# Patient Record
Sex: Female | Born: 1980 | Race: Black or African American | Hispanic: No | Marital: Single | Smoking: Current every day smoker
Health system: Southern US, Community
[De-identification: ages and names within clinical notes are randomized; demographics above are authoritative.]

## PROBLEM LIST (undated history)

## (undated) DIAGNOSIS — J45909 Unspecified asthma, uncomplicated: Secondary | ICD-10-CM

## (undated) DIAGNOSIS — O09299 Supervision of pregnancy with other poor reproductive or obstetric history, unspecified trimester: Secondary | ICD-10-CM

## (undated) DIAGNOSIS — R519 Headache, unspecified: Secondary | ICD-10-CM

## (undated) DIAGNOSIS — O2300 Infections of kidney in pregnancy, unspecified trimester: Secondary | ICD-10-CM

## (undated) DIAGNOSIS — A599 Trichomoniasis, unspecified: Secondary | ICD-10-CM

## (undated) DIAGNOSIS — E559 Vitamin D deficiency, unspecified: Secondary | ICD-10-CM

## (undated) HISTORY — DX: Vitamin D deficiency, unspecified: E55.9

## (undated) HISTORY — PX: NO PAST SURGERIES: SHX2092

## (undated) HISTORY — DX: Trichomoniasis, unspecified: A59.9

## (undated) HISTORY — DX: Infections of kidney in pregnancy, unspecified trimester: O23.00

## (undated) HISTORY — DX: Headache, unspecified: R51.9

## (undated) HISTORY — DX: Supervision of pregnancy with other poor reproductive or obstetric history, unspecified trimester: O09.299

---
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
## 2003-09-04 ENCOUNTER — Ambulatory Visit (HOSPITAL_COMMUNITY): Admission: RE | Admit: 2003-09-04 | Discharge: 2003-09-04 | Payer: Self-pay | Admitting: Obstetrics & Gynecology

## 2003-09-26 ENCOUNTER — Inpatient Hospital Stay (HOSPITAL_COMMUNITY): Admission: AD | Admit: 2003-09-26 | Discharge: 2003-09-29 | Payer: Self-pay | Admitting: Obstetrics

## 2003-10-02 ENCOUNTER — Inpatient Hospital Stay (HOSPITAL_COMMUNITY): Admission: AD | Admit: 2003-10-02 | Discharge: 2003-10-02 | Payer: Self-pay | Admitting: Obstetrics

## 2003-11-22 ENCOUNTER — Ambulatory Visit (HOSPITAL_COMMUNITY): Admission: RE | Admit: 2003-11-22 | Discharge: 2003-11-22 | Payer: Self-pay | Admitting: Obstetrics & Gynecology

## 2004-01-06 ENCOUNTER — Inpatient Hospital Stay (HOSPITAL_COMMUNITY): Admission: AD | Admit: 2004-01-06 | Discharge: 2004-01-07 | Payer: Self-pay | Admitting: Obstetrics

## 2004-01-25 ENCOUNTER — Inpatient Hospital Stay (HOSPITAL_COMMUNITY): Admission: RE | Admit: 2004-01-25 | Discharge: 2004-01-25 | Payer: Self-pay | Admitting: Obstetrics & Gynecology

## 2004-01-28 ENCOUNTER — Inpatient Hospital Stay (HOSPITAL_COMMUNITY): Admission: AD | Admit: 2004-01-28 | Discharge: 2004-02-01 | Payer: Self-pay | Admitting: Obstetrics & Gynecology

## 2005-05-11 ENCOUNTER — Emergency Department (HOSPITAL_COMMUNITY): Admission: EM | Admit: 2005-05-11 | Discharge: 2005-05-11 | Payer: Self-pay | Admitting: Emergency Medicine

## 2005-06-08 ENCOUNTER — Emergency Department (HOSPITAL_COMMUNITY): Admission: EM | Admit: 2005-06-08 | Discharge: 2005-06-08 | Payer: Self-pay | Admitting: Emergency Medicine

## 2005-12-23 ENCOUNTER — Inpatient Hospital Stay (HOSPITAL_COMMUNITY): Admission: AD | Admit: 2005-12-23 | Discharge: 2005-12-26 | Payer: Self-pay | Admitting: Obstetrics and Gynecology

## 2006-05-24 ENCOUNTER — Emergency Department (HOSPITAL_COMMUNITY): Admission: EM | Admit: 2006-05-24 | Discharge: 2006-05-24 | Payer: Self-pay | Admitting: Emergency Medicine

## 2006-06-03 ENCOUNTER — Inpatient Hospital Stay (HOSPITAL_COMMUNITY): Admission: AD | Admit: 2006-06-03 | Discharge: 2006-06-03 | Payer: Self-pay | Admitting: Family Medicine

## 2006-06-17 ENCOUNTER — Ambulatory Visit: Payer: Self-pay | Admitting: *Deleted

## 2007-08-08 ENCOUNTER — Emergency Department (HOSPITAL_COMMUNITY): Admission: EM | Admit: 2007-08-08 | Discharge: 2007-08-08 | Payer: Self-pay | Admitting: Emergency Medicine

## 2007-09-19 ENCOUNTER — Emergency Department (HOSPITAL_COMMUNITY): Admission: EM | Admit: 2007-09-19 | Discharge: 2007-09-19 | Payer: Self-pay | Admitting: Emergency Medicine

## 2007-12-28 ENCOUNTER — Inpatient Hospital Stay (HOSPITAL_COMMUNITY): Admission: AD | Admit: 2007-12-28 | Discharge: 2007-12-28 | Payer: Self-pay | Admitting: Obstetrics and Gynecology

## 2008-05-01 ENCOUNTER — Inpatient Hospital Stay (HOSPITAL_COMMUNITY): Admission: AD | Admit: 2008-05-01 | Discharge: 2008-05-02 | Payer: Self-pay | Admitting: Obstetrics & Gynecology

## 2008-06-30 ENCOUNTER — Inpatient Hospital Stay (HOSPITAL_COMMUNITY): Admission: AD | Admit: 2008-06-30 | Discharge: 2008-06-30 | Payer: Self-pay | Admitting: Obstetrics & Gynecology

## 2008-07-21 ENCOUNTER — Inpatient Hospital Stay (HOSPITAL_COMMUNITY): Admission: AD | Admit: 2008-07-21 | Discharge: 2008-07-21 | Payer: Self-pay | Admitting: Obstetrics and Gynecology

## 2008-07-22 ENCOUNTER — Inpatient Hospital Stay (HOSPITAL_COMMUNITY): Admission: AD | Admit: 2008-07-22 | Discharge: 2008-07-23 | Payer: Self-pay | Admitting: Obstetrics and Gynecology

## 2009-05-13 ENCOUNTER — Emergency Department (HOSPITAL_COMMUNITY): Admission: EM | Admit: 2009-05-13 | Discharge: 2009-05-13 | Payer: Self-pay | Admitting: Emergency Medicine

## 2009-10-04 ENCOUNTER — Inpatient Hospital Stay (HOSPITAL_COMMUNITY): Admission: AD | Admit: 2009-10-04 | Discharge: 2009-10-06 | Payer: Self-pay | Admitting: Obstetrics and Gynecology

## 2010-04-12 ENCOUNTER — Emergency Department (HOSPITAL_COMMUNITY): Admission: EM | Admit: 2010-04-12 | Discharge: 2010-04-12 | Payer: Self-pay | Admitting: Emergency Medicine

## 2010-11-30 ENCOUNTER — Emergency Department (HOSPITAL_COMMUNITY)
Admission: EM | Admit: 2010-11-30 | Discharge: 2010-11-30 | Disposition: A | Discharge status: Home / Self Care | Payer: Medicaid Other | Attending: Emergency Medicine | Admitting: Emergency Medicine

## 2010-11-30 DIAGNOSIS — H109 Unspecified conjunctivitis: Secondary | ICD-10-CM | POA: Insufficient documentation

## 2010-12-02 ENCOUNTER — Emergency Department (HOSPITAL_COMMUNITY)
Admission: EM | Admit: 2010-12-02 | Discharge: 2010-12-02 | Disposition: A | Discharge status: Home / Self Care | Payer: Medicaid Other | Attending: Emergency Medicine | Admitting: Emergency Medicine

## 2010-12-02 DIAGNOSIS — H109 Unspecified conjunctivitis: Secondary | ICD-10-CM | POA: Insufficient documentation

## 2010-12-02 DIAGNOSIS — H5789 Other specified disorders of eye and adnexa: Secondary | ICD-10-CM | POA: Insufficient documentation

## 2010-12-21 LAB — URINALYSIS, ROUTINE W REFLEX MICROSCOPIC
Bilirubin Urine: NEGATIVE
Glucose, UA: NEGATIVE mg/dL
Hgb urine dipstick: NEGATIVE
Ketones, ur: NEGATIVE mg/dL
Nitrite: NEGATIVE
Protein, ur: NEGATIVE mg/dL
Specific Gravity, Urine: 1.008 (ref 1.005–1.030)
Urobilinogen, UA: 4 mg/dL — ABNORMAL HIGH (ref 0.0–1.0)
pH: 7 (ref 5.0–8.0)

## 2010-12-21 LAB — DIFFERENTIAL
Basophils Absolute: 0 10*3/uL (ref 0.0–0.1)
Basophils Relative: 0 % (ref 0–1)
Eosinophils Absolute: 0 10*3/uL (ref 0.0–0.7)
Eosinophils Relative: 1 % (ref 0–5)
Lymphocytes Relative: 19 % (ref 12–46)
Lymphs Abs: 1.3 10*3/uL (ref 0.7–4.0)
Monocytes Absolute: 0.4 10*3/uL (ref 0.1–1.0)
Monocytes Relative: 6 % (ref 3–12)
Neutro Abs: 5.2 10*3/uL (ref 1.7–7.7)
Neutrophils Relative %: 75 % (ref 43–77)

## 2010-12-21 LAB — CBC
HCT: 37.8 % (ref 36.0–46.0)
HCT: 32.6 % — ABNORMAL LOW (ref 36.0–46.0)
Hemoglobin: 13 g/dL (ref 12.0–15.0)
Hemoglobin: 10.6 g/dL — ABNORMAL LOW (ref 12.0–15.0)
MCH: 28.3 pg (ref 26.0–34.0)
MCHC: 34.3 g/dL (ref 30.0–36.0)
MCHC: 32.7 g/dL (ref 30.0–36.0)
MCV: 82.3 fL (ref 78.0–100.0)
MCV: 80.9 fL (ref 78.0–100.0)
Platelets: 194 10*3/uL (ref 150–400)
Platelets: 170 10*3/uL (ref 150–400)
RBC: 4.59 MIL/uL (ref 3.87–5.11)
RBC: 4.03 MIL/uL (ref 3.87–5.11)
RDW: 14.4 % (ref 11.5–15.5)
RDW: 15.5 % (ref 11.5–15.5)
WBC: 6.9 10*3/uL (ref 4.0–10.5)
WBC: 11.7 10*3/uL — ABNORMAL HIGH (ref 4.0–10.5)

## 2010-12-21 LAB — COMPREHENSIVE METABOLIC PANEL
ALT: 13 U/L (ref 0–35)
AST: 15 U/L (ref 0–37)
Albumin: 3.6 g/dL (ref 3.5–5.2)
Alkaline Phosphatase: 62 U/L (ref 39–117)
BUN: 3 mg/dL — ABNORMAL LOW (ref 6–23)
CO2: 28 mEq/L (ref 19–32)
Calcium: 8.8 mg/dL (ref 8.4–10.5)
Chloride: 102 mEq/L (ref 96–112)
Creatinine, Ser: 0.63 mg/dL (ref 0.4–1.2)
GFR calc Af Amer: >60 mL/min (ref >60)
GFR calc non Af Amer: >60 mL/min (ref >60)
Glucose, Bld: 75 mg/dL (ref 70–99)
Potassium: 3.1 mEq/L — ABNORMAL LOW (ref 3.5–5.1)
Sodium: 135 mEq/L (ref 135–145)
Total Bilirubin: 0.6 mg/dL (ref 0.3–1.2)
Total Protein: 6.9 g/dL (ref 6.0–8.3)

## 2010-12-21 LAB — GC/CHLAMYDIA PROBE AMP, GENITAL
Chlamydia, DNA Probe: POSITIVE — AB
GC Probe Amp, Genital: POSITIVE — AB

## 2010-12-21 LAB — URINE MICROSCOPIC-ADD ON

## 2010-12-21 LAB — URINE CULTURE
Colony Count: NO GROWTH
Culture: NO GROWTH

## 2010-12-21 LAB — WET PREP, GENITAL: Yeast Wet Prep HPF POC: NONE SEEN

## 2011-01-05 LAB — CBC
HCT: 34.5 % — ABNORMAL LOW (ref 36.0–46.0)
Hemoglobin: 11.2 g/dL — ABNORMAL LOW (ref 12.0–15.0)
MCHC: 32.5 g/dL (ref 30.0–36.0)
MCV: 81.8 fL (ref 78.0–100.0)
Platelets: 174 10*3/uL (ref 150–400)
RBC: 4.21 MIL/uL (ref 3.87–5.11)
RDW: 15.5 % (ref 11.5–15.5)
WBC: 9.5 10*3/uL (ref 4.0–10.5)

## 2011-01-05 LAB — RPR: RPR Ser Ql: NONREACTIVE

## 2011-01-10 LAB — URINALYSIS, ROUTINE W REFLEX MICROSCOPIC
Bilirubin Urine: NEGATIVE
Glucose, UA: NEGATIVE mg/dL
Hgb urine dipstick: NEGATIVE
Ketones, ur: NEGATIVE mg/dL
Nitrite: NEGATIVE
Protein, ur: NEGATIVE mg/dL
Specific Gravity, Urine: 1.024 (ref 1.005–1.030)
Urobilinogen, UA: 1 mg/dL (ref 0.0–1.0)
pH: 7 (ref 5.0–8.0)

## 2011-01-10 LAB — POCT PREGNANCY, URINE: Preg Test, Ur: POSITIVE

## 2011-02-20 NOTE — Discharge Summary (Signed)
NAME:  Poplaski, Kani L                          ACCOUNT NO.:  [id number]   MEDICAL RECORD NO.:  [id number]                   PATIENT TYPE:  INP   LOCATION:  9153                                 [facility]ACILITY:  [facility]   PHYSICIAN:  Charles A. Clearance Coots, M.D.             DATE OF BIRTH:  26-Jan-1981   DATE OF ADMISSION:  09/26/2003  DATE OF DISCHARGE:  09/28/2003                                 DISCHARGE SUMMARY   ADMITTING DIAGNOSIS:  Twenty-three weeks gestation.  Episode of fetal  bradycardia in the office.   DISCHARGE DIAGNOSIS:  Twenty-three weeks gestation.  Episode of fetal  bradycardia in the office, status post observation and external fetal  monitoring at [facility] [place] with reassuring findings.  Ultrasound within normal limits.   The patient was discharged home, undelivered, at 41 and 5/7ths weeks  gestation in good condition.   FOLLOW UP:  In the office in two weeks.   REASON FOR ADMISSION:  A 30 year old black female, G1, with estimated date  of confinement of January 11, 2004 by last menstrual period, and January 24, 2004  by first ultrasound, which is the best estimated date of confinement.  Presented to the office with a complaint of constant abdominal pain and  decreased fetal movement.  Fetal heart rate with the Doppler was 50-60 beats  per minute initially, and portable ultrasound was done which confirmed that  fetal heart rate.  The patient was placed on her side, and IV was quickly  started, and the fetal heart rate increased to 140-150 beats per minute  after 4-5 minutes.  The patient received an IV fluid bolus in the office,  and Emergency Medical Services were called to transfer the patient to  [facility].   PAST MEDICAL HISTORY:  1. Surgery - none.  2. Illnesses - none.   MEDICATIONS:  1. Prenatal vitamins.  2. Antibiotic for a urinary tract infection.   ALLERGIES:  No known drug allergies.   SOCIAL HISTORY:  Single.  Negative tobacco, alcohol,  or recreational drug  use.   PHYSICAL EXAMINATION:  GENERAL:  Slim black female in no acute distress.  LUNGS:  Clear to auscultation bilaterally.  HEART:  Regular rate and rhythm.  ABDOMEN:  Gravid, soft, nontender.   LABORATORY DATA:  Admitting laboratory values revealed hemoglobin of 10.6,  hematocrit 30.6, white blood cell count 7000, platelets 246,000.   HOSPITAL COURSE:  The patient was admitted and initially placed on  continuous external fetal monitoring.  There were no further fetal heart  rate decelerations noted.  She was then observed for another 24 hours,  monitoring external fetal heart rate every shift, and the monitoring  continued to be reassuring.  The patient was therefore discharged home on  hospital day #2, undelivered at 40 and 5/7ths weeks gestation.  Ultrasound  was done on hospital day #1, which revealed anterior grade 1 placenta,  normal amniotic fluid,  normal cervical length of 3.4 cm, and appropriate  interval fetal growth, based on the first ultrasound which was done at [redacted]  weeks gestation.   DISCHARGE MEDICATIONS:  Continue prenatal vitamins.   DISCHARGE INSTRUCTIONS:  Routine written obstetrical discharge instructions  for undelivered patients were given.   ACTIVITY:  The patient is to be restricted to modified bed rest at home.   FOLLOW UP:  She is to follow up in the office for a prenatal visit in two  weeks.                                               Charles A. Clearance Coots, M.D.    CAH/MEDQ  D:  09/28/2003  T:  09/28/2003  Job:  [id number]

## 2011-02-20 NOTE — Group Therapy Note (Signed)
[name], Horn                ACCOUNT NO.:  [id number]   MEDICAL RECORD NO.:  [id number]          PATIENT TYPE:  WOC   LOCATION:  [facility]                   FACILITY:  [facility]   PHYSICIAN:  Carolanne Grumbling, M.D.   DATE OF BIRTH:  1981-03-21   DATE OF SERVICE:                                    CLINIC NOTE   CHIEF COMPLAINT:  Followup on miscarriage.   HISTORY OF PRESENT ILLNESS:  A 30 year old G3, P2-0-1-2, here for MAU  followup on a spontaneous abortion. The patient presented on June 03, 2006  to the MAU and was found to have no fetal heart tones and opted for expected  management. The patient reports that she passed particles of conception on  June 05, 2006. Lochia was heavy but has since stopped. The patient was  also diagnosed with Chlamydia on my MAU visit but has not had treatment yet.  She does not desire pregnancy at this point and would like to be on birth  control.   PAST MEDICAL HISTORY:  Negative.   PAST SURGICAL HISTORY:  Negative.   GYNECOLOGIC HISTORY:  Recently had a pap smear in March for her postpartum  examination. Denies any abnormalities.   SOCIAL HISTORY:  She is single. Lives with her children. No cigarettes.  Denies alcohol or drug use.   FAMILY HISTORY:  Rachel Horn has diabetes. History of heart attack and  hypertension. Grandmother also has Hypertension.   MEDICATIONS:  None.   ALLERGIES:  PENICILLIN.   PHYSICAL EXAMINATION:  VITAL SIGNS:  Temperature 97. Blood pressure 131/86,  pulse 76, weight 118.5 pounds. Height 5 foot 1 and 1/2 inches.  GENERAL:  Well developed, well nourished female in no acute distress.  HEENT:  Normocephalic and atraumatic.  CARDIOVASCULAR:  Regular rate and rhythm.  LUNGS:  Clear to auscultation.  ABDOMEN:  Soft, nontender, and nondistended.  GENITOURINARY:  Vagina pink and rugae. Cervix, multiparus. Uterus within  normal limits. Adnexa free of masses.  EXTREMITIES:  No clubbing, cyanosis, or edema.   ASSESSMENT:  1. Status post spontaneous abortion.  2. Chlamydia.  3. Elevated blood pressure.   PLAN:  The case was discussed with Dr. Perlie Gold. Will check a beta HCG  quantitative value. If that is within normal limits, no further workup is  needed.  1. Azithromycin 1,000 mg p.o. x1, given today. The patient counseled that      other partners needed to be treated.  2. Depo-Provera, given today. The patient to get that every 3 months.  3. Encouraged her to check her blood pressure and to followup. She may      need to be on hydrochlorothiazide.           ______________________________  Carolanne Grumbling, M.D.     TW/MEDQ  D:  06/21/2006  T:  06/21/2006  Job:  [id number]

## 2011-05-19 ENCOUNTER — Emergency Department (HOSPITAL_COMMUNITY)
Admission: EM | Admit: 2011-05-19 | Discharge: 2011-05-19 | Disposition: A | Discharge status: Home / Self Care | Payer: Medicaid Other | Attending: Emergency Medicine | Admitting: Emergency Medicine

## 2011-05-19 DIAGNOSIS — J3489 Other specified disorders of nose and nasal sinuses: Secondary | ICD-10-CM | POA: Insufficient documentation

## 2011-05-19 DIAGNOSIS — K089 Disorder of teeth and supporting structures, unspecified: Secondary | ICD-10-CM | POA: Insufficient documentation

## 2011-05-19 DIAGNOSIS — K029 Dental caries, unspecified: Secondary | ICD-10-CM | POA: Insufficient documentation

## 2011-05-19 DIAGNOSIS — R220 Localized swelling, mass and lump, head: Secondary | ICD-10-CM | POA: Insufficient documentation

## 2011-06-29 LAB — CBC
HCT: 35.3 — ABNORMAL LOW
Hemoglobin: 11.7 — ABNORMAL LOW
MCHC: 33.3
Platelets: 260
RDW: 13.6

## 2011-06-29 LAB — URINALYSIS, ROUTINE W REFLEX MICROSCOPIC
Bilirubin Urine: NEGATIVE
Nitrite: NEGATIVE
Specific Gravity, Urine: 1.02
Urobilinogen, UA: 1
pH: 8.5 — ABNORMAL HIGH

## 2011-06-29 LAB — WET PREP, GENITAL: Clue Cells Wet Prep HPF POC: NONE SEEN

## 2011-06-29 LAB — GC/CHLAMYDIA PROBE AMP, GENITAL: Chlamydia, DNA Probe: POSITIVE — AB

## 2011-07-06 LAB — CBC
Hemoglobin: 9.7 — ABNORMAL LOW
RBC: 3.64 — ABNORMAL LOW
WBC: 9.6

## 2011-07-06 LAB — RPR: RPR Ser Ql: NONREACTIVE

## 2011-07-13 ENCOUNTER — Inpatient Hospital Stay (INDEPENDENT_AMBULATORY_CARE_PROVIDER_SITE_OTHER)
Admission: RE | Admit: 2011-07-13 | Discharge: 2011-07-13 | Disposition: A | Discharge status: Home / Self Care | Payer: Medicaid Other | Source: Ambulatory Visit | Attending: Family Medicine | Admitting: Family Medicine

## 2011-07-13 ENCOUNTER — Emergency Department (HOSPITAL_COMMUNITY): Payer: Medicaid Other

## 2011-07-13 ENCOUNTER — Emergency Department (HOSPITAL_COMMUNITY)
Admission: EM | Admit: 2011-07-13 | Discharge: 2011-07-13 | Disposition: A | Discharge status: Home / Self Care | Payer: Medicaid Other | Attending: Emergency Medicine | Admitting: Emergency Medicine

## 2011-07-13 DIAGNOSIS — J36 Peritonsillar abscess: Secondary | ICD-10-CM | POA: Insufficient documentation

## 2011-07-13 DIAGNOSIS — J029 Acute pharyngitis, unspecified: Secondary | ICD-10-CM | POA: Insufficient documentation

## 2011-07-13 LAB — CBC
MCH: 28.7 pg (ref 26.0–34.0)
MCHC: 34.3 g/dL (ref 30.0–36.0)
Platelets: 217 10*3/uL (ref 150–400)
RBC: 4.78 MIL/uL (ref 3.87–5.11)

## 2011-07-13 LAB — POCT I-STAT, CHEM 8
BUN: 6 mg/dL (ref 6–23)
Calcium, Ion: 1.14 mmol/L (ref 1.12–1.32)
Hemoglobin: 14.6 g/dL (ref 12.0–15.0)
Sodium: 137 mEq/L (ref 135–145)
TCO2: 23 mmol/L (ref 0–100)

## 2011-07-13 LAB — DIFFERENTIAL
Basophils Absolute: 0 10*3/uL (ref 0.0–0.1)
Basophils Relative: 0 % (ref 0–1)
Eosinophils Absolute: 0 10*3/uL (ref 0.0–0.7)
Monocytes Relative: 8 % (ref 3–12)
Neutro Abs: 15.2 10*3/uL — ABNORMAL HIGH (ref 1.7–7.7)
Neutrophils Relative %: 85 % — ABNORMAL HIGH (ref 43–77)

## 2011-07-13 LAB — WET PREP, GENITAL
Trich, Wet Prep: NONE SEEN
Yeast Wet Prep HPF POC: NONE SEEN

## 2011-07-13 MED ORDER — IOHEXOL 300 MG/ML  SOLN
80.0000 mL | Freq: Once | INTRAMUSCULAR | Status: AC | PRN
  Administered 2011-07-13: 80 mL via INTRAVENOUS

## 2011-07-14 LAB — GC/CHLAMYDIA PROBE AMP, GENITAL
Chlamydia, DNA Probe: NEGATIVE
Chlamydia, DNA Probe: POSITIVE — AB
GC Probe Amp, Genital: NEGATIVE
GC Probe Amp, Genital: NEGATIVE

## 2011-07-14 LAB — WET PREP, GENITAL: Yeast Wet Prep HPF POC: NONE SEEN

## 2011-07-14 LAB — POCT PREGNANCY, URINE
Operator id: 19830
Preg Test, Ur: NEGATIVE

## 2011-07-14 LAB — URINALYSIS, ROUTINE W REFLEX MICROSCOPIC
Bilirubin Urine: NEGATIVE
Hgb urine dipstick: NEGATIVE
Nitrite: NEGATIVE
Protein, ur: NEGATIVE
Urobilinogen, UA: 1

## 2011-07-15 ENCOUNTER — Emergency Department (HOSPITAL_COMMUNITY)
Admission: EM | Admit: 2011-07-15 | Discharge: 2011-07-15 | Disposition: A | Discharge status: Home / Self Care | Payer: Medicaid Other | Attending: Emergency Medicine | Admitting: Emergency Medicine

## 2011-07-15 DIAGNOSIS — F17200 Nicotine dependence, unspecified, uncomplicated: Secondary | ICD-10-CM | POA: Insufficient documentation

## 2011-07-15 DIAGNOSIS — J039 Acute tonsillitis, unspecified: Secondary | ICD-10-CM | POA: Insufficient documentation

## 2011-07-15 NOTE — Consult Note (Signed)
NAMEJAHNAVI, Rachel Horn                ACCOUNT NO.:  [id number]  MEDICAL RECORD NO.:  [id number]  LOCATION:  [facility]                         FACILITY:  [facility]  PHYSICIAN:  Melvenia Beam, MD      DATE OF BIRTH:  [birth year]  DATE OF CONSULTATION:  07/15/2011 DATE OF DISCHARGE:                                CONSULTATION   REASON FOR CONSULTATION:  Suspected right peritonsillar abscess.  HISTORY OF PRESENT ILLNESS:  This is a 30 year old African American female who has a history of a recent right peritonsillar phlegmon and chronic tonsillitis and this was drained on July 13, 2011, by the patient's report.  She reports that she has been having difficulty taking p.o. over the last 4 days and has had recurrent swelling and pain on the right side of the throat.  ENT is consulted for possible recurrent peritonsillar abscess.  PAST MEDICAL HISTORY/PAST SURGICAL HISTORY:  Significant for chronic tonsillitis.  MEDICATIONS:  She takes no medicines at home.  ALLERGIES:  PENICILLIN.  PHYSICAL EXAMINATION:  VITAL SIGNS:  She is afebrile, heart rate is 80s to 90s, respiratory rate is 12 to 16, and vital signs are otherwise stable and reviewed on the ED chart. HEENT:  Tympanic membranes were unclear bilaterally. NECK:  Supple.  No lymphadenopathy. NEUROLOGIC:  Cranial nerves II through XII were grossly intact and symmetric bilaterally.  Extraocular movements are intact.  Pupils are equal, round, and reactive to light and accommodation.  Anterior rhinoscopy demonstrates normal appearing septum and turbinates. Oral cavity exam demonstrates a right 4+ tonsils with a left 2+ tonsils. The right tonsil has an exudate over the entire medial surface.  There was evidence of a previous incision and drainage site on the palatoglossal muscle.  PROCEDURE NOTE:  42700, incision and drainage of right peritonsillar phlegmon after explaining the risks including bleeding, infection, injury to the carotid  artery and obtaining informed verbal consent, the area was anesthetized with viscous lidocaine and benzocaine spray and then injection with 1% lidocaine with epinephrine.  Once this had taken effect, the area was aspirated multiple times with an 18-gauge needle with a 5 cc syringe, only minimal phlegmon was aspirated.  No evidence of any significant peritonsillar abscess was aspirated from multiple sites medially, laterally, and superiorly.  The previous incision and drainage site was re-incised with a 15 blade and widely spread in the peritonsillar space using the Kelly clamp.  This was suctioned and again no peritonsillar pus was seen.  IMPRESSION AND PLAN:  Likely right severe tonsillar hypertrophy with acute on chronic tonsillitis without any evidence of significant peritonsillar phlegmon or abscess.  I strongly counseled the patient that she will need a tonsillectomy once the infection and inflammation are calmed down,  or this cycle of severe tonsillitis/tonsillar cellulitis will likely keep repeating itself. She will need a bilateral tonsillectomy to prevent this severe tonsillitis from happening chronically and it would be dangerous to do this in the acute setting as her risk of bleeding would be extremely high as the right tonsil was acutely inflamed.  I would like to calm her down with doxycycline and prednisone liquid as she states she is not able to take pills  and I will see her back in my office at [facility] in 1 week to set her up for bilateral tonsillectomy. She understands that she should call sooner or return to the ER if she has any intractable bleeding or poor PO intake or airway problems.          ______________________________ Melvenia Beam, MD     MG/MEDQ  D:  07/15/2011  T:  07/15/2011  Job:  [id number]  Electronically Signed by Melvenia Beam MD on 07/15/2011 12:47:42 PM

## 2011-11-02 ENCOUNTER — Encounter (HOSPITAL_COMMUNITY): Payer: Self-pay | Admitting: *Deleted

## 2011-11-02 ENCOUNTER — Emergency Department (HOSPITAL_COMMUNITY)
Admission: EM | Admit: 2011-11-02 | Discharge: 2011-11-02 | Disposition: A | Discharge status: Home / Self Care | Payer: Medicaid Other | Attending: Emergency Medicine | Admitting: Emergency Medicine

## 2011-11-02 DIAGNOSIS — K089 Disorder of teeth and supporting structures, unspecified: Secondary | ICD-10-CM | POA: Insufficient documentation

## 2011-11-02 DIAGNOSIS — K047 Periapical abscess without sinus: Secondary | ICD-10-CM | POA: Insufficient documentation

## 2011-11-02 DIAGNOSIS — K056 Periodontal disease, unspecified: Secondary | ICD-10-CM | POA: Insufficient documentation

## 2011-11-02 DIAGNOSIS — K029 Dental caries, unspecified: Secondary | ICD-10-CM | POA: Insufficient documentation

## 2011-11-02 DIAGNOSIS — R6884 Jaw pain: Secondary | ICD-10-CM | POA: Insufficient documentation

## 2011-11-02 MED ORDER — HYDROCODONE-ACETAMINOPHEN 5-325 MG PO TABS: 2.0000 | ORAL_TABLET | ORAL | Status: AC | PRN

## 2011-11-02 MED ORDER — CLINDAMYCIN HCL 150 MG PO CAPS: 150.0000 mg | ORAL_CAPSULE | Freq: Four times a day (QID) | ORAL | Status: AC

## 2011-11-02 NOTE — ED Provider Notes (Signed)
History     CSN: [id number]  Arrival date & time 11/02/11  1831   First MD Initiated Contact with Patient 11/02/11 1908      Chief Complaint  Patient presents with  . Dental Pain    (Consider location/radiation/quality/duration/timing/severity/associated sxs/prior treatment) HPI Comments: Patient here with worsening toothache for past 2 days - states had tooth with filling, which fell out months ago -now with pain and has worn down to the root.  Patient is a 31 y.o. female presenting with tooth pain. The history is provided by the patient. No language interpreter was used.  Dental PainPrimary symptoms do not include mouth pain, dental injury, oral bleeding, oral lesions, headaches, fever, shortness of breath, sore throat, angioedema or cough. The symptoms began 2 days ago. The symptoms are worsening. The symptoms are chronic. The symptoms occur constantly.  Additional symptoms include: dental sensitivity to temperature, gum tenderness and jaw pain. Additional symptoms do not include: purulent gums, trismus, facial swelling, pain with swallowing, dry mouth, smell disturbance, drooling, ear pain, hearing loss, swollen glands and fatigue. Medical issues include: periodontal disease.    History reviewed. No pertinent past medical history.  History reviewed. No pertinent past surgical history.  History reviewed. No pertinent family history.  History  Substance Use Topics  . Smoking status: Current Everyday Smoker  . Smokeless tobacco: Not on file  . Alcohol Use: No    OB History    Grav Para Term Preterm Abortions TAB SAB Ect Mult Living                  Review of Systems  Constitutional: Negative for fever and fatigue.  HENT: Negative for hearing loss, ear pain, sore throat, facial swelling and drooling.   Respiratory: Negative for cough and shortness of breath.   Neurological: Negative for headaches.  All other systems reviewed and are negative.    Allergies  Latex and  Penicillins  Home Medications   Current Outpatient Rx  Name Route Sig Dispense Refill  . NAPROXEN SODIUM 220 MG PO TABS Oral Take 220 mg by mouth 2 (two) times daily as needed. For pain.    Marland Kitchen CLINDAMYCIN HCL 150 MG PO CAPS Oral Take 1 capsule (150 mg total) by mouth every 6 (six) hours. 28 capsule 0  . HYDROCODONE-ACETAMINOPHEN 5-325 MG PO TABS Oral Take 2 tablets by mouth every 4 (four) hours as needed for pain. 20 tablet 0    BP 115/71  Pulse 64  Temp(Src) 98.8 F (37.1 C) (Oral)  Resp 18  SpO2 98%  Physical Exam  Nursing note and vitals reviewed. Constitutional: She is oriented to person, place, and time. She appears well-developed and well-nourished. No distress.  HENT:  Head: Normocephalic and atraumatic.  Right Ear: External ear normal.  Left Ear: External ear normal.  Nose: Nose normal.  Mouth/Throat: Oropharynx is clear and moist. No oropharyngeal exudate.    Eyes: Conjunctivae are normal. Pupils are equal, round, and reactive to light. No scleral icterus.  Neck: Normal range of motion. Neck supple.  Cardiovascular: Normal rate, regular rhythm and normal heart sounds.  Exam reveals no gallop and no friction rub.   No murmur heard. Pulmonary/Chest: Effort normal and breath sounds normal. No respiratory distress.  Abdominal: Soft. Bowel sounds are normal. There is no tenderness.  Musculoskeletal: Normal range of motion.  Lymphadenopathy:    She has no cervical adenopathy.  Neurological: She is alert and oriented to person, place, and time. No cranial nerve deficit.  Skin: Skin is warm and dry.  Psychiatric: She has a normal mood and affect. Her behavior is normal. Judgment and thought content normal.    ED Course  Procedures (including critical care time)  Labs Reviewed - No data to display No results found.   1. Dental abscess       MDM  Marked decay and early abscess formation - is to follow up with her dentist this coming week.        Izola Price [place], [place] 11/02/11 2050

## 2011-11-02 NOTE — ED Notes (Signed)
She has had a toothache for 2 days

## 2011-11-02 NOTE — ED Provider Notes (Signed)
Medical screening examination/treatment/procedure(s) were performed by non-physician practitioner and as supervising physician I was immediately available for consultation/collaboration.   Benny Lennert, MD 11/02/11 2116

## 2012-01-07 ENCOUNTER — Encounter (HOSPITAL_COMMUNITY): Payer: Self-pay | Admitting: Emergency Medicine

## 2012-01-07 ENCOUNTER — Emergency Department (HOSPITAL_COMMUNITY)
Admission: EM | Admit: 2012-01-07 | Discharge: 2012-01-08 | Disposition: A | Discharge status: Home / Self Care | Payer: Medicaid Other | Attending: Emergency Medicine | Admitting: Emergency Medicine

## 2012-01-07 DIAGNOSIS — F17200 Nicotine dependence, unspecified, uncomplicated: Secondary | ICD-10-CM | POA: Insufficient documentation

## 2012-01-07 DIAGNOSIS — A5901 Trichomonal vulvovaginitis: Secondary | ICD-10-CM | POA: Insufficient documentation

## 2012-01-07 DIAGNOSIS — R51 Headache: Secondary | ICD-10-CM | POA: Insufficient documentation

## 2012-01-07 MED ORDER — SODIUM CHLORIDE 0.9 % IV BOLUS (SEPSIS)
1000.0000 mL | Freq: Once | INTRAVENOUS | Status: AC
  Administered 2012-01-08: 1000 mL via INTRAVENOUS

## 2012-01-07 MED ORDER — ONDANSETRON HCL 4 MG/2ML IJ SOLN
4.0000 mg | Freq: Once | INTRAMUSCULAR | Status: AC
  Administered 2012-01-08: 4 mg via INTRAVENOUS
  Filled 2012-01-07: qty 2

## 2012-01-07 NOTE — ED Notes (Signed)
Headache for several days.  She vomited earllier

## 2012-01-07 NOTE — ED Notes (Signed)
PT. REPORTS HEADACHE TODAY WITH VOMITTING AND DIARRHEA YESTERDAY.

## 2012-01-08 LAB — WET PREP, GENITAL
Clue Cells Wet Prep HPF POC: NONE SEEN
Yeast Wet Prep HPF POC: NONE SEEN

## 2012-01-08 MED ORDER — METRONIDAZOLE 500 MG PO TABS: 500.0000 mg | ORAL_TABLET | Freq: Once | ORAL | Status: DC

## 2012-01-08 MED ORDER — METRONIDAZOLE 500 MG PO TABS: 500.0000 mg | ORAL_TABLET | Freq: Two times a day (BID) | ORAL | Status: AC

## 2012-01-08 MED ORDER — ONDANSETRON HCL 4 MG PO TABS: 4.0000 mg | ORAL_TABLET | Freq: Four times a day (QID) | ORAL | Status: AC

## 2012-01-08 NOTE — Discharge Instructions (Signed)
Trichomoniasis  Trichomoniasis is an infection, caused by the Trichomonas organism, that affects both women and men. In women, the outer female genitalia and the vagina are affected. In men, the penis is mainly affected, but the prostate and other reproductive organs can also be involved. Trichomoniasis is a sexually transmitted disease (STD) and is most often passed to another person through sexual contact. The majority of people who get trichomoniasis do so from a sexual encounter and are also at risk for other STDs.  CAUSES    Sexual intercourse with an infected partner.   It can be present in swimming pools or hot tubs.  SYMPTOMS    Abnormal gray-green frothy vaginal discharge in women.   Vaginal itching and irritation in women.   Itching and irritation of the area outside the vagina in women.   Penile discharge with or without pain in males.   Inflammation of the urethra (urethritis), causing painful urination.   Bleeding after sexual intercourse.  RELATED COMPLICATIONS   Pelvic inflammatory disease.   Infection of the uterus (endometritis).   Infertility.   Tubal (ectopic) pregnancy.   It can be associated with other STDs, including gonorrhea and chlamydia, hepatitis B, and HIV.  COMPLICATIONS DURING PREGNANCY   Early (premature) delivery.   Premature rupture of the membranes (PROM).   Low birth weight.  DIAGNOSIS    Visualization of Trichomonas under the microscope from the vagina discharge.   Ph of the vagina greater than 4.5, tested with a test tape.   Trich Rapid Test.   Culture of the organism, but this is not usually needed.   It may be found on a Pap test.   Having a "strawberry cervix,"which means the cervix looks very red like a strawberry.  TREATMENT    You may be given medication to fight the infection. Inform your caregiver if you could be or are pregnant. Some medications used to treat the infection should not be taken during pregnancy.   Over-the-counter medications or  creams to decrease itching or irritation may be recommended.   Your sexual partner will need to be treated if infected.  HOME CARE INSTRUCTIONS    Take all medication prescribed by your caregiver.   Take over-the-counter medication for itching or irritation as directed by your caregiver.   Do not have sexual intercourse while you have the infection.   Do not douche or wear tampons.   Discuss your infection with your partner, as your partner may have acquired the infection from you. Or, your partner may have been the person who transmitted the infection to you.   Have your sex partner examined and treated if necessary.   Practice safe, informed, and protected sex.   See your caregiver for other STD testing.  SEEK MEDICAL CARE IF:    You still have symptoms after you finish the medication.   You have an oral temperature above 102 F (38.9 C).   You develop belly (abdominal) pain.   You have pain when you urinate.   You have bleeding after sexual intercourse.   You develop a rash.   The medication makes you sick or makes you throw up (vomit).  Document Released: 03/17/2001 Document Revised: 09/10/2011 Document Reviewed: 04/12/2009  ExitCare Patient Information 2012 [employer].

## 2012-01-08 NOTE — ED Provider Notes (Addendum)
History     CSN: [id number]  Arrival date & time 01/07/12  2105   First MD Initiated Contact with Patient 01/07/12 2322      Chief Complaint  Patient presents with  . Headache    (Consider location/radiation/quality/duration/timing/severity/associated sxs/prior treatment) HPI Comments: Nausea and vomiting, unrelieved by any over-the-counter medication.  Patient was taken throughout the day  The history is provided by the patient.    History reviewed. No pertinent past medical history.  History reviewed. No pertinent past surgical history.  No family history on file.  History  Substance Use Topics  . Smoking status: Current Everyday Smoker  . Smokeless tobacco: Not on file  . Alcohol Use: No    OB History    Grav Para Term Preterm Abortions TAB SAB Ect Mult Living                  Review of Systems  Gastrointestinal: Positive for nausea and vomiting.  Neurological: Positive for headaches.    Allergies  Latex and Penicillins  Home Medications   Current Outpatient Rx  Name Route Sig Dispense Refill  . METRONIDAZOLE 500 MG PO TABS Oral Take 1 tablet (500 mg total) by mouth 2 (two) times daily. 14 tablet 0  . ONDANSETRON HCL 4 MG PO TABS Oral Take 1 tablet (4 mg total) by mouth every 6 (six) hours. 12 tablet 0    BP 120/72  Pulse 70  Temp(Src) 98.4 F (36.9 C) (Oral)  Resp 16  SpO2 99%  Physical Exam  Constitutional: She appears well-developed and well-nourished.  HENT:  Head: Normocephalic.  Eyes: Pupils are equal, round, and reactive to light.  Neck: Normal range of motion.  Cardiovascular: Normal rate.   Pulmonary/Chest: Effort normal.  Abdominal: She exhibits no distension. There is no tenderness.  Genitourinary: Uterus normal. Vaginal discharge found.  Musculoskeletal: Normal range of motion.  Skin: Skin is warm.    ED Course  Procedures (including critical care time)  Labs Reviewed  WET PREP, GENITAL - Abnormal; Notable for the  following:    Trich, Wet Prep MODERATE (*)    WBC, Wet Prep HPF POC FEW (*)    All other components within normal limits  POCT PREGNANCY, URINE  GC/CHLAMYDIA PROBE AMP, GENITAL   No results found.   1. Trichomonal vaginitis    Feels better after the IV fluids   MDM  Nausea, vomiting, vaginal discharge       Arman Filter, NP 01/08/12 0150  Arman Filter, NP 01/08/12 0150  Arman Filter, NP 03/08/12 2109

## 2012-01-08 NOTE — ED Provider Notes (Signed)
Medical screening examination/treatment/procedure(s) were performed by non-physician practitioner and as supervising physician I was immediately available for consultation/collaboration.  Harith Mccadden M Edithe Dobbin, MD 01/08/12 0705 

## 2012-01-09 LAB — GC/CHLAMYDIA PROBE AMP, GENITAL: Chlamydia, DNA Probe: NEGATIVE

## 2012-03-09 NOTE — ED Provider Notes (Signed)
Medical screening examination/treatment/procedure(s) were performed by non-physician practitioner and as supervising physician I was immediately available for consultation/collaboration.  Sanaa Zilberman M Flynn Lininger, MD 03/09/12 0347 

## 2012-06-30 ENCOUNTER — Ambulatory Visit
Admission: RE | Admit: 2012-06-30 | Discharge: 2012-06-30 | Disposition: A | Discharge status: Home / Self Care | Payer: No Typology Code available for payment source | Source: Ambulatory Visit | Attending: Infectious Diseases | Admitting: Infectious Diseases

## 2012-06-30 ENCOUNTER — Other Ambulatory Visit: Payer: Self-pay | Admitting: Infectious Diseases

## 2012-06-30 DIAGNOSIS — R7611 Nonspecific reaction to tuberculin skin test without active tuberculosis: Secondary | ICD-10-CM

## 2012-08-04 ENCOUNTER — Emergency Department (HOSPITAL_COMMUNITY)
Admission: EM | Admit: 2012-08-04 | Discharge: 2012-08-04 | Disposition: A | Discharge status: Home Health | Payer: Medicaid Other | Attending: Emergency Medicine | Admitting: Emergency Medicine

## 2012-08-04 DIAGNOSIS — R51 Headache: Secondary | ICD-10-CM

## 2012-08-04 DIAGNOSIS — J029 Acute pharyngitis, unspecified: Secondary | ICD-10-CM | POA: Insufficient documentation

## 2012-08-04 DIAGNOSIS — F17200 Nicotine dependence, unspecified, uncomplicated: Secondary | ICD-10-CM | POA: Insufficient documentation

## 2012-08-04 MED ORDER — KETOROLAC TROMETHAMINE 60 MG/2ML IM SOLN
60.0000 mg | Freq: Once | INTRAMUSCULAR | Status: AC
  Administered 2012-08-04: 60 mg via INTRAMUSCULAR
  Filled 2012-08-04: qty 2

## 2012-08-04 MED ORDER — IBUPROFEN 800 MG PO TABS: 800.0000 mg | ORAL_TABLET | Freq: Three times a day (TID) | ORAL | Status: DC

## 2012-08-04 NOTE — ED Notes (Signed)
Drinking soda without problems

## 2012-08-04 NOTE — ED Provider Notes (Addendum)
History     CSN: [id number]  Arrival date & time 08/04/12  0902   First MD Initiated Contact with Patient 08/04/12 [phone]      Chief Complaint  Patient presents with  . Headache    (Consider location/radiation/quality/duration/timing/severity/associated sxs/prior treatment) HPI Comments: Patient complains of 5 days of gradual onset headache that is intermittent. It is associated with feeling of phlegm in her throat and pain with swallowing. She also endorses loose stools for the past 5 days and happen once daily. No abdominal pain, fever, vomiting, chest pain or shortness of breath. No rashes. No sick contacts. No other medical problems. She thinks her headache is due to stress. She denies thunderclap onset. She denies any weakness, numbness or tingling.  The history is provided by the patient.    No past medical history on file.  No past surgical history on file.  No family history on file.  History  Substance Use Topics  . Smoking status: Current Every Day Smoker  . Smokeless tobacco: Not on file  . Alcohol Use: No    OB History    Grav Para Term Preterm Abortions TAB SAB Ect Mult Living                  Review of Systems  Constitutional: Negative for fever, activity change and appetite change.  HENT: Positive for congestion, sore throat, rhinorrhea and trouble swallowing.   Respiratory: Negative for cough, chest tightness and shortness of breath.   Cardiovascular: Negative for chest pain.  Gastrointestinal: Negative for nausea, vomiting and abdominal pain.  Musculoskeletal: Negative for back pain.  Skin: Negative for wound.  Neurological: Positive for headaches. Negative for dizziness.  A complete 10 system review of systems was obtained and all systems are negative except as noted in the HPI and PMH.    Allergies  Latex and Penicillins  Home Medications  No current outpatient prescriptions on file.  BP 115/78  Temp 98.2 F (36.8 C) (Oral)  Resp 20  SpO2  99%  Physical Exam  Constitutional: She is oriented to person, place, and time. She appears well-developed and well-nourished. No distress.  HENT:  Head: Normocephalic and atraumatic.  Mouth/Throat: Oropharynx is clear and moist. No oropharyngeal exudate.       No exudate, no tonsillar asymmetry, no tongue elevation R upper molar TTP without abscess  Eyes: Conjunctivae normal and EOM are normal. Pupils are equal, round, and reactive to light.  Neck: Normal range of motion. Neck supple.       No meningismus  Cardiovascular: Normal rate, regular rhythm and normal heart sounds.   No murmur heard. Pulmonary/Chest: Effort normal and breath sounds normal. No respiratory distress.  Abdominal: Soft. There is no tenderness. There is no rebound and no guarding.  Musculoskeletal: Normal range of motion. She exhibits no edema and no tenderness.  Neurological: She is alert and oriented to person, place, and time. No cranial nerve deficit.       5/5 strength throughout, no ataxia on finger to nose, CN 2-12 intact  Skin: Skin is warm.    ED Course  Procedures (including critical care time)   Labs Reviewed  RAPID STREP SCREEN   No results found.   No diagnosis found.    MDM  Sore throat, and gradual onset headache for the past several days. Nontoxic appearing, no distress, vitals stable.  Nonfocal neurological exam. Rapid strep negative. Tolerating by mouth in the ED. Symptom relief with Toradol. Supportive care, followup with PCP.  Glynn Octave, MD 08/04/12 1008  Glynn Octave, MD 11/08/12 [phone]

## 2012-08-04 NOTE — ED Notes (Signed)
Patient swallows without difficulty

## 2012-08-04 NOTE — ED Notes (Signed)
Pt c/o throat soreness and headache x 2 days

## 2013-03-11 ENCOUNTER — Encounter (HOSPITAL_COMMUNITY): Payer: Self-pay | Admitting: Adult Health

## 2013-03-11 ENCOUNTER — Emergency Department (HOSPITAL_COMMUNITY)
Admission: EM | Admit: 2013-03-11 | Discharge: 2013-03-11 | Disposition: A | Discharge status: Home / Self Care | Payer: Medicaid Other | Attending: Emergency Medicine | Admitting: Emergency Medicine

## 2013-03-11 DIAGNOSIS — Z91040 Latex allergy status: Secondary | ICD-10-CM | POA: Insufficient documentation

## 2013-03-11 DIAGNOSIS — R51 Headache: Secondary | ICD-10-CM | POA: Insufficient documentation

## 2013-03-11 DIAGNOSIS — F17200 Nicotine dependence, unspecified, uncomplicated: Secondary | ICD-10-CM | POA: Insufficient documentation

## 2013-03-11 DIAGNOSIS — Z975 Presence of (intrauterine) contraceptive device: Secondary | ICD-10-CM | POA: Insufficient documentation

## 2013-03-11 DIAGNOSIS — R599 Enlarged lymph nodes, unspecified: Secondary | ICD-10-CM | POA: Insufficient documentation

## 2013-03-11 DIAGNOSIS — R05 Cough: Secondary | ICD-10-CM | POA: Insufficient documentation

## 2013-03-11 DIAGNOSIS — J029 Acute pharyngitis, unspecified: Secondary | ICD-10-CM | POA: Insufficient documentation

## 2013-03-11 DIAGNOSIS — R1110 Vomiting, unspecified: Secondary | ICD-10-CM | POA: Insufficient documentation

## 2013-03-11 DIAGNOSIS — R059 Cough, unspecified: Secondary | ICD-10-CM | POA: Insufficient documentation

## 2013-03-11 DIAGNOSIS — Z880 Allergy status to penicillin: Secondary | ICD-10-CM | POA: Insufficient documentation

## 2013-03-11 MED ORDER — ONDANSETRON 4 MG PO TBDP
8.0000 mg | ORAL_TABLET | Freq: Once | ORAL | Status: AC
  Administered 2013-03-11: 8 mg via ORAL
  Filled 2013-03-11: qty 2

## 2013-03-11 MED ORDER — OXYCODONE HCL 5 MG/5ML PO SOLN: 5.0000 mg | ORAL | Status: DC | PRN

## 2013-03-11 MED ORDER — AZITHROMYCIN 250 MG PO TABS: 250.0000 mg | ORAL_TABLET | Freq: Every day | ORAL | Status: DC

## 2013-03-11 MED ORDER — DEXAMETHASONE 10 MG/ML FOR PEDIATRIC ORAL USE
10.0000 mg | Freq: Once | INTRAMUSCULAR | Status: AC
  Administered 2013-03-11: 10 mg via ORAL
  Filled 2013-03-11 (×2): qty 1

## 2013-03-11 MED ORDER — OXYCODONE HCL 5 MG/5ML PO SOLN
5.0000 mg | Freq: Once | ORAL | Status: AC
  Administered 2013-03-11: 5 mg via ORAL
  Filled 2013-03-11: qty 5

## 2013-03-11 NOTE — ED Notes (Addendum)
Presents with sore throat that began this am. Pt states, "I woke up with a sore throat and it feels like strep".

## 2013-03-11 NOTE — ED Provider Notes (Signed)
History  This chart was scribed for non-physician practitioner Dierdre Forth, PA-C working with Vida Roller, MD, by Candelaria Stagers, ED Scribe. This patient was seen in room TR08C/TR08C and the patient's care was started at 7:44 PM   CSN: [id number]  Arrival date & time 03/11/13  1928   First MD Initiated Contact with Patient 03/11/13 1935      Chief Complaint  Patient presents with  . Sore Throat    The history is provided by the patient. No language interpreter was used.   HPI Comments: Rachel Horn is a 32 y.o. female who presents to the Emergency Department complaining of scratchy sore throat that started upon waking today.  Pt is also experiencing a productive cough and headache.  She reports she has vomited after coughing episodes.  She denies rash, fever, chills, or abdominal pain.  Pt has been drinking, but is experiencing loss of appetite.  She has taken ibuprofen with some relief.  Pt's child is experiencing similar sx.     History reviewed. No pertinent past medical history.  History reviewed. No pertinent past surgical history.  History reviewed. No pertinent family history.  History  Substance Use Topics  . Smoking status: Current Every Day Smoker  . Smokeless tobacco: Not on file  . Alcohol Use: No    OB History   Grav Para Term Preterm Abortions TAB SAB Ect Mult Living                  Review of Systems  HENT: Positive for sore throat.   Respiratory: Positive for cough.   Gastrointestinal: Positive for vomiting. Negative for abdominal pain.  Neurological: Positive for headaches.  All other systems reviewed and are negative.    Allergies  Latex and Penicillins  Home Medications   Current Outpatient Rx  Name  Route  Sig  Dispense  Refill  . ibuprofen (ADVIL,MOTRIN) 800 MG tablet   Oral   Take 1 tablet (800 mg total) by mouth 3 (three) times daily.   21 tablet   0   . levonorgestrel (MIRENA) 20 MCG/24HR IUD   Intrauterine   1 each by  Intrauterine route once.         Marland Kitchen azithromycin (ZITHROMAX) 250 MG tablet   Oral   Take 1 tablet (250 mg total) by mouth daily. Take first 2 tablets together, then 1 every day until finished.   6 tablet   0   . oxyCODONE (ROXICODONE) 5 MG/5ML solution   Oral   Take 5 mLs (5 mg total) by mouth every 4 (four) hours as needed for pain.   30 mL   0     BP 129/91  Pulse 87  Temp(Src) 98.1 F (36.7 C) (Oral)  Resp 18  SpO2 98%  Physical Exam  Nursing note and vitals reviewed. Constitutional: She is oriented to person, place, and time. She appears well-developed and well-nourished. No distress.  HENT:  Head: Normocephalic and atraumatic.  Right Ear: Tympanic membrane, external ear and ear canal normal. Tympanic membrane is not erythematous and not bulging. No middle ear effusion.  Left Ear: Tympanic membrane and external ear normal. Tympanic membrane is not erythematous and not bulging.  No middle ear effusion.  Nose: No mucosal edema or rhinorrhea.  Mouth/Throat: Mucous membranes are dry and not cyanotic. No edematous. Posterior oropharyngeal edema and posterior oropharyngeal erythema present. No oropharyngeal exudate or tonsillar abscesses.  Left canal erythematous.  Mildly erythematous oropharynx.  Mildly swollen tonsils.  No mucous drainage to orpharynx.  No mucosal edema of the nose. No rhinorrhea.    Eyes: Conjunctivae are normal. No scleral icterus.  Neck: Trachea normal, normal range of motion, full passive range of motion without pain and phonation normal. Neck supple. No tracheal tenderness, no spinous process tenderness and no muscular tenderness present. No rigidity. No tracheal deviation and normal range of motion present.  Cardiovascular: Normal rate, regular rhythm, normal heart sounds and intact distal pulses.   No murmur heard. Pulmonary/Chest: Effort normal and breath sounds normal. No stridor. No respiratory distress. She has no wheezes.  Abdominal: Soft. Bowel  sounds are normal. She exhibits no mass. There is no tenderness. There is no rebound and no guarding.  Musculoskeletal: Normal range of motion. She exhibits no edema.  Lymphadenopathy:       Head (right side): Tonsillar adenopathy present. No submental, no submandibular, no preauricular, no posterior auricular and no occipital adenopathy present.       Head (left side): Tonsillar adenopathy present. No submental, no submandibular, no preauricular, no posterior auricular and no occipital adenopathy present.    She has cervical adenopathy.       Right cervical: Superficial cervical (mild) adenopathy present. No deep cervical and no posterior cervical adenopathy present.      Left cervical: Superficial cervical (mild) adenopathy present. No deep cervical and no posterior cervical adenopathy present.  Neurological: She is alert and oriented to person, place, and time. She exhibits normal muscle tone. Coordination normal.  Speech is clear and goal oriented Moves extremities without ataxia  Skin: Skin is warm and dry. No rash noted. She is not diaphoretic. No erythema.  Psychiatric: She has a normal mood and affect.    ED Course  Procedures   DIAGNOSTIC STUDIES: Oxygen Saturation is 98% on room air, normal by my interpretation.    COORDINATION OF CARE:     Labs Reviewed  RAPID STREP SCREEN  CULTURE, GROUP A STREP   No results found.   1. Viral pharyngitis       MDM  [name] presents with sore throat.  Pt afebrile without tonsillar exudate, negative strep. Presents with mild cervical lymphadenopathy, & dysphagia; diagnosis of viral pharyngitis. Discussed that abx are not indicated, however since pt does not have PCP f/u with d/c with abx and recommend that she not fill them unless she becomes worse.  D/C w symptomatic tx for pain.  Pt does not appear dehydrated, but did discuss importance of water rehydration. Presentation non concerning for PTA or infxn spread to soft tissue.  No trismus or uvula deviation. Specific return precautions discussed. Pt able to drink water in ED without difficulty with intact air way. Recommended PCP follow up. I have also discussed reasons to return immediately to the ER.  Patient expresses understanding and agrees with plan.   I personally performed the services described in this documentation, which was scribed in my presence. The recorded information has been reviewed and is accurate.     Dahlia Client Raine Blodgett, PA-C 03/11/13 2013

## 2013-03-12 NOTE — ED Provider Notes (Signed)
  Medical screening examination/treatment/procedure(s) were performed by non-physician practitioner and as supervising physician I was immediately available for consultation/collaboration.    Vida Roller, MD 03/12/13 2019

## 2013-03-13 LAB — CULTURE, GROUP A STREP

## 2014-05-05 ENCOUNTER — Emergency Department (HOSPITAL_COMMUNITY)
Admission: EM | Admit: 2014-05-05 | Discharge: 2014-05-05 | Disposition: A | Discharge status: Home / Self Care | Payer: Medicaid Other | Attending: Emergency Medicine | Admitting: Emergency Medicine

## 2014-05-05 ENCOUNTER — Encounter (HOSPITAL_COMMUNITY): Payer: Self-pay | Admitting: Emergency Medicine

## 2014-05-05 DIAGNOSIS — F17200 Nicotine dependence, unspecified, uncomplicated: Secondary | ICD-10-CM | POA: Diagnosis not present

## 2014-05-05 DIAGNOSIS — N12 Tubulo-interstitial nephritis, not specified as acute or chronic: Secondary | ICD-10-CM | POA: Diagnosis not present

## 2014-05-05 DIAGNOSIS — Z3202 Encounter for pregnancy test, result negative: Secondary | ICD-10-CM | POA: Diagnosis not present

## 2014-05-05 DIAGNOSIS — M549 Dorsalgia, unspecified: Secondary | ICD-10-CM | POA: Diagnosis present

## 2014-05-05 DIAGNOSIS — Z91040 Latex allergy status: Secondary | ICD-10-CM | POA: Insufficient documentation

## 2014-05-05 DIAGNOSIS — Z880 Allergy status to penicillin: Secondary | ICD-10-CM | POA: Diagnosis not present

## 2014-05-05 LAB — URINE MICROSCOPIC-ADD ON

## 2014-05-05 LAB — URINALYSIS, ROUTINE W REFLEX MICROSCOPIC
BILIRUBIN URINE: NEGATIVE
GLUCOSE, UA: NEGATIVE mg/dL
Ketones, ur: 15 mg/dL — AB
Nitrite: POSITIVE — AB
PROTEIN: 30 mg/dL — AB
Specific Gravity, Urine: 1.016 (ref 1.005–1.030)
UROBILINOGEN UA: 1 mg/dL (ref 0.0–1.0)
pH: 6 (ref 5.0–8.0)

## 2014-05-05 LAB — POC URINE PREG, ED: PREG TEST UR: NEGATIVE

## 2014-05-05 MED ORDER — CEFTRIAXONE SODIUM 1 G IJ SOLR
1.0000 g | Freq: Once | INTRAMUSCULAR | Status: AC
  Administered 2014-05-05: 1 g via INTRAMUSCULAR
  Filled 2014-05-05: qty 10

## 2014-05-05 MED ORDER — OXYCODONE-ACETAMINOPHEN 5-325 MG PO TABS
1.0000 | ORAL_TABLET | Freq: Once | ORAL | Status: AC
  Administered 2014-05-05: 1 via ORAL
  Filled 2014-05-05: qty 1

## 2014-05-05 MED ORDER — LIDOCAINE HCL (PF) 1 % IJ SOLN
5.0000 mL | Freq: Once | INTRAMUSCULAR | Status: AC
  Administered 2014-05-05: 5 mL
  Filled 2014-05-05: qty 5

## 2014-05-05 MED ORDER — HYDROCODONE-ACETAMINOPHEN 5-325 MG PO TABS: 1.0000 | ORAL_TABLET | Freq: Four times a day (QID) | ORAL | Status: DC | PRN

## 2014-05-05 MED ORDER — CEPHALEXIN 500 MG PO CAPS: 500.0000 mg | ORAL_CAPSULE | Freq: Four times a day (QID) | ORAL | Status: DC

## 2014-05-05 NOTE — ED Notes (Signed)
Pt presents to department for evaluation of back pain. States pain became worse this morning while at work. 8/10 pain at the time. Pt is alert and oriented x4.

## 2014-05-05 NOTE — Discharge Instructions (Signed)
Keflex as prescribed until all gone. Ibuprofen for pain. Norco for severe pain. Follow up in 1 week for recheck. Return if worsening.    Pyelonephritis, Adult Pyelonephritis is a kidney infection. In general, there are 2 main types of pyelonephritis:  Infections that come on quickly without any warning (acute pyelonephritis).  Infections that persist for a long period of time (chronic pyelonephritis). CAUSES  Two main causes of pyelonephritis are:  Bacteria traveling from the bladder to the kidney. This is a problem especially in pregnant women. The urine in the bladder can become filled with bacteria from multiple causes, including:  Inflammation of the prostate gland (prostatitis).  Sexual intercourse in females.  Bladder infection (cystitis).  Bacteria traveling from the bloodstream to the tissue part of the kidney. Problems that may increase your risk of getting a kidney infection include:  Diabetes.  Kidney stones or bladder stones.  Cancer.  Catheters placed in the bladder.  Other abnormalities of the kidney or ureter. SYMPTOMS   Abdominal pain.  Pain in the side or flank area.  Fever.  Chills.  Upset stomach.  Blood in the urine (dark urine).  Frequent urination.  Strong or persistent urge to urinate.  Burning or stinging when urinating. DIAGNOSIS  Your caregiver may diagnose your kidney infection based on your symptoms. A urine sample may also be taken. TREATMENT  In general, treatment depends on how severe the infection is.   If the infection is mild and caught early, your caregiver may treat you with oral antibiotics and send you home.  If the infection is more severe, the bacteria may have gotten into the bloodstream. This will require intravenous (IV) antibiotics and a hospital stay. Symptoms may include:  High fever.  Severe flank pain.  Shaking chills.  Even after a hospital stay, your caregiver may require you to be on oral antibiotics  for a period of time.  Other treatments may be required depending upon the cause of the infection. HOME CARE INSTRUCTIONS   Take your antibiotics as directed. Finish them even if you start to feel better.  Make an appointment to have your urine checked to make sure the infection is gone.  Drink enough fluids to keep your urine clear or pale yellow.  Take medicines for the bladder if you have urgency and frequency of urination as directed by your caregiver. SEEK IMMEDIATE MEDICAL CARE IF:   You have a fever or persistent symptoms for more than 2-3 days.  You have a fever and your symptoms suddenly get worse.  You are unable to take your antibiotics or fluids.  You develop shaking chills.  You experience extreme weakness or fainting.  There is no improvement after 2 days of treatment. MAKE SURE YOU:  Understand these instructions.  Will watch your condition.  Will get help right away if you are not doing well or get worse. Document Released: 09/21/2005 Document Revised: 03/22/2012 Document Reviewed: 02/25/2011 [facility] Patient Information 2015 [employer] [place]. This information is not intended to replace advice given to you by your health care provider. Make sure you discuss any questions you have with your health care provider.

## 2014-05-05 NOTE — ED Provider Notes (Signed)
CSN: [id number]     Arrival date & time 05/05/14  1536 History  This chart was scribed for Jaynie Crumbleatyana Celestine Bougie, PA, working with Flint MelterElliott L Wentz, MD by Chestine SporeSoijett Blue, ED Scribe. The patient was seen in room TR06C/TR06C at 5:44 PM.     Chief Complaint  Patient presents with  . Back Pain     The history is provided by the patient. No language interpreter was used.   HPI Comments: Rachel Horn is a 33 y.o. female who presents to the Emergency Department complaining of radiating back pain onset today. She states that the pain radiates down the back of her legs. She states that she woke up this morning in pain but she thought it would go away. She states that her back pain became worse this morning while at work. 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 She states that she has had back pain before but nothing like this. She rates her pain as a 8/10. She states that she thinks she pulled a muscle in her back. She states that she is having associated symptoms of numbness in her feet and weakness in her legs.  She states that she did not take any medications PTA. She states that she was given a percocet here that is helping with her pain a little. She denies pain when she urinates. She states that she has had an UTI before. She denies fever, loss of bowel or urine, frequency.  History reviewed. No pertinent past medical history. History reviewed. No pertinent past surgical history. No family history on file. History  Substance Use Topics  . Smoking status: Current Every Day Smoker    Types: Cigarettes  . Smokeless tobacco: Not on file  . Alcohol Use: No   OB History   Grav Para Term Preterm Abortions TAB SAB Ect Mult Living                 Review of Systems  Constitutional: Negative for fever.  Gastrointestinal:       Loss of bowel.   Genitourinary: Negative for dysuria and frequency.       Loss of urine  Musculoskeletal: Positive for back pain.  Neurological: Positive for weakness (in both  legs) and numbness (in both feet).      Allergies  Latex and Penicillins  Home Medications   Prior to Admission medications   Not on File   BP 116/74  Pulse 102  Temp(Src) 98.5 F (36.9 C) (Oral)  Resp 22  SpO2 100%  Physical Exam  Nursing note and vitals reviewed. Constitutional: She is oriented to person, place, and time. She appears well-developed and well-nourished. No distress.  HENT:  Head: Normocephalic and atraumatic.  Eyes: EOM are normal.  Neck: Neck supple. No tracheal deviation present.  Cardiovascular: Normal rate.   Pulmonary/Chest: Effort normal. No respiratory distress.  Abdominal: Soft. Bowel sounds are normal. There is no tenderness.  bilateral CVA tenderness  Musculoskeletal: Normal range of motion.  No midline lumbar spine tenderness. No pain with bilateral straight leg raise. No tenderness over musculature of lower back.   Neurological: She is alert and oriented to person, place, and time.  5/5 and equal lower extremity strength. 2+ and equal patellar reflexes bilaterally. Pt able to dorsiflex bilateral toes and feet with good strength against resistance. Equal sensation bilaterally over thighs and lower legs.   Skin: Skin is warm and dry.  Psychiatric: She has a normal mood and affect. Her behavior is normal.  ED Course  Procedures (including critical care time) DIAGNOSTIC STUDIES: Oxygen Saturation is 100% on room air, normal by my interpretation.    COORDINATION OF CARE: 5:50 PM-Discussed treatment plan which includes UA , Rocephin, Percocet with pt at bedside and pt agreed to plan.   Labs Review Labs Reviewed  URINALYSIS, ROUTINE W REFLEX MICROSCOPIC - Abnormal; Notable for the following:    APPearance CLOUDY (*)    Hgb urine dipstick LARGE (*)    Ketones, ur 15 (*)    Protein, ur 30 (*)    Nitrite POSITIVE (*)    Leukocytes, UA LARGE (*)    All other components within normal limits  URINE MICROSCOPIC-ADD ON - Abnormal; Notable for  the following:    Bacteria, UA MANY (*)    All other components within normal limits  POC URINE PREG, ED    Imaging Review No results found.   EKG Interpretation None      MDM   Final diagnoses:  Pyelonephritis   Patient with bilateral CVA tenderness, otherwise unremarkable exam. She came in today with back pain, atraumatic. Urine analysis obtained, showing infection. Patient is positive for nitrites and large leukocyte esterase, too numerous to count white blood cells, many bacteria. She denied any vaginal discharge, denies possibility of STD. There is no epithelial cells in the sample, suspect she does have pyelonephritis. At this time she is afebrile, nontoxic appearing, no nausea vomiting. No abdominal pain no tenderness. Will also give a 1 g of Rocephin IM here, Keflex at home for infection, followup in 1 week or sooner if worsening.  Filed Vitals:   05/05/14 1541  BP: 116/74  Pulse: 102  Temp: 98.5 F (36.9 C)  TempSrc: Oral  Resp: 22  SpO2: 100%     I personally performed the services described in this documentation, which was scribed in my presence. The recorded information has been reviewed and is accurate.    Lottie Mussel, PA-C 05/05/14 2321

## 2014-05-06 NOTE — ED Provider Notes (Signed)
Medical screening examination/treatment/procedure(s) were performed by non-physician practitioner and as supervising physician I was immediately available for consultation/collaboration.   EKG Interpretation None       Flint MelterElliott L Norlan Rann, MD 05/06/14 [phone]

## 2014-10-05 ENCOUNTER — Emergency Department (HOSPITAL_COMMUNITY)
Admission: EM | Admit: 2014-10-05 | Discharge: 2014-10-05 | Disposition: A | Discharge status: Home / Self Care | Payer: Medicaid Other | Attending: Emergency Medicine | Admitting: Emergency Medicine

## 2014-10-05 ENCOUNTER — Encounter (HOSPITAL_COMMUNITY): Payer: Self-pay | Admitting: Physical Medicine and Rehabilitation

## 2014-10-05 DIAGNOSIS — Z792 Long term (current) use of antibiotics: Secondary | ICD-10-CM | POA: Insufficient documentation

## 2014-10-05 DIAGNOSIS — Y9389 Activity, other specified: Secondary | ICD-10-CM | POA: Diagnosis not present

## 2014-10-05 DIAGNOSIS — S199XXA Unspecified injury of neck, initial encounter: Secondary | ICD-10-CM | POA: Insufficient documentation

## 2014-10-05 DIAGNOSIS — R112 Nausea with vomiting, unspecified: Secondary | ICD-10-CM | POA: Insufficient documentation

## 2014-10-05 DIAGNOSIS — M545 Low back pain, unspecified: Secondary | ICD-10-CM

## 2014-10-05 DIAGNOSIS — Y92410 Unspecified street and highway as the place of occurrence of the external cause: Secondary | ICD-10-CM | POA: Insufficient documentation

## 2014-10-05 DIAGNOSIS — Z880 Allergy status to penicillin: Secondary | ICD-10-CM | POA: Diagnosis not present

## 2014-10-05 DIAGNOSIS — Y998 Other external cause status: Secondary | ICD-10-CM | POA: Insufficient documentation

## 2014-10-05 DIAGNOSIS — S3992XA Unspecified injury of lower back, initial encounter: Secondary | ICD-10-CM | POA: Diagnosis not present

## 2014-10-05 DIAGNOSIS — Z720 Tobacco use: Secondary | ICD-10-CM | POA: Diagnosis not present

## 2014-10-05 DIAGNOSIS — Z91040 Latex allergy status: Secondary | ICD-10-CM | POA: Insufficient documentation

## 2014-10-05 MED ORDER — ONDANSETRON 4 MG PO TBDP
4.0000 mg | ORAL_TABLET | Freq: Once | ORAL | Status: AC
  Administered 2014-10-05: 4 mg via ORAL
  Filled 2014-10-05: qty 1

## 2014-10-05 MED ORDER — IBUPROFEN 400 MG PO TABS
800.0000 mg | ORAL_TABLET | Freq: Once | ORAL | Status: AC
  Administered 2014-10-05: 800 mg via ORAL
  Filled 2014-10-05: qty 2

## 2014-10-05 MED ORDER — CYCLOBENZAPRINE HCL 10 MG PO TABS: 10.0000 mg | ORAL_TABLET | Freq: Two times a day (BID) | ORAL | Status: DC | PRN

## 2014-10-05 MED ORDER — HYDROCODONE-ACETAMINOPHEN 5-325 MG PO TABS: 1.0000 | ORAL_TABLET | ORAL | Status: DC | PRN
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
## 2014-10-05 NOTE — Discharge Instructions (Signed)
Take Vicodin as needed for pain. Take Flexeril as needed for muscle spasm. Refer to attached documents for more information. Return to the ED with worsening or concerning symptoms.  °
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
## 2015-01-28 ENCOUNTER — Encounter (HOSPITAL_COMMUNITY): Payer: Self-pay | Admitting: Emergency Medicine

## 2015-01-28 ENCOUNTER — Emergency Department (HOSPITAL_COMMUNITY)
Admission: EM | Admit: 2015-01-28 | Discharge: 2015-01-29 | Disposition: A | Discharge status: Home / Self Care | Payer: Medicaid Other | Attending: Emergency Medicine | Admitting: Emergency Medicine

## 2015-01-28 DIAGNOSIS — B9689 Other specified bacterial agents as the cause of diseases classified elsewhere: Secondary | ICD-10-CM

## 2015-01-28 DIAGNOSIS — Z3202 Encounter for pregnancy test, result negative: Secondary | ICD-10-CM | POA: Insufficient documentation

## 2015-01-28 DIAGNOSIS — B373 Candidiasis of vulva and vagina: Secondary | ICD-10-CM | POA: Insufficient documentation

## 2015-01-28 DIAGNOSIS — N760 Acute vaginitis: Secondary | ICD-10-CM | POA: Diagnosis not present

## 2015-01-28 DIAGNOSIS — N921 Excessive and frequent menstruation with irregular cycle: Secondary | ICD-10-CM | POA: Diagnosis not present

## 2015-01-28 DIAGNOSIS — Z720 Tobacco use: Secondary | ICD-10-CM | POA: Diagnosis not present

## 2015-01-28 DIAGNOSIS — Z880 Allergy status to penicillin: Secondary | ICD-10-CM | POA: Diagnosis not present

## 2015-01-28 DIAGNOSIS — Z792 Long term (current) use of antibiotics: Secondary | ICD-10-CM | POA: Insufficient documentation

## 2015-01-28 DIAGNOSIS — N939 Abnormal uterine and vaginal bleeding, unspecified: Secondary | ICD-10-CM | POA: Diagnosis present

## 2015-01-28 DIAGNOSIS — B3731 Acute candidiasis of vulva and vagina: Secondary | ICD-10-CM

## 2015-01-28 DIAGNOSIS — Z91040 Latex allergy status: Secondary | ICD-10-CM | POA: Insufficient documentation

## 2015-01-28 NOTE — ED Notes (Signed)
Pt states she thinks she may be pregnant but if she is she thinks she may be having a miscarriage  Pt states her last period was a couple weeks ago  Pt states she just had her IUD taken out  Pt states the discharge is brown in color

## 2015-01-28 NOTE — ED Provider Notes (Signed)
CSN: [id number]     Arrival date & time 01/28/15  2230 History   First MD Initiated Contact with Patient 01/28/15 2341     Chief Complaint  Patient presents with  . Vaginal Bleeding   Patient is a 34 y.o. female presenting with vaginal bleeding. The history is provided by the patient. No language interpreter was used.  Vaginal Bleeding Associated symptoms: abdominal pain   Associated symptoms: no dysuria, no fever and no nausea    This chart was scribed for non-physician practitioner [name], PA-C, working with Mirian Mo, MD, by Andrew Au, ED Scribe. This patient was seen in room WA03/WA03 and the patient's care was started at 2:55 AM.  Rachel Horn is a 34 y.o. female who presents to the Emergency Department complaining of vaginal bleeding. Pt states while at work she noticed some brown discharge. Pt recently had IUD removed January 09 2015. She states her last LMP was April 8th 2016, which lasted 3 days and reports that it was her first time menstruating in 4 years. Pt is sexually active with men and admits to not using protection consistently. She has experienced some mild abdominal cramping and concerned that she may be having a miscarriage. Pt reports miscarriage in the past. She denies fever, dysuria, hematuria.   History reviewed. No pertinent past medical history. History reviewed. No pertinent past surgical history. History reviewed. No pertinent family history. History  Substance Use Topics  . Smoking status: Current Every Day Smoker    Types: Cigarettes  . Smokeless tobacco: Not on file  . Alcohol Use: No   OB History    No data available      Review of Systems  Constitutional: Negative for fever.  Gastrointestinal: Positive for abdominal pain. Negative for nausea, vomiting and diarrhea.  Genitourinary: Positive for vaginal bleeding. Negative for dysuria and hematuria.    Allergies  Latex and Penicillins  Home Medications   Prior to Admission medications    Medication Sig Start Date End Date Taking? Authorizing Provider  cephALEXin (KEFLEX) 500 MG capsule Take 1 capsule (500 mg total) by mouth 4 (four) times daily. Patient not taking: Reported on 01/29/2015 05/05/14   [name], PA-C  cyclobenzaprine (FLEXERIL) 10 MG tablet Take 1 tablet (10 mg total) by mouth 2 (two) times daily as needed for muscle spasms. Patient not taking: Reported on 01/29/2015 10/05/14   [name], PA-C  HYDROcodone-acetaminophen (NORCO/VICODIN) 5-325 MG per tablet Take 1-2 tablets by mouth every 4 (four) hours as needed for moderate pain or severe pain. Patient not taking: Reported on 01/29/2015 10/05/14   [name], PA-C  metroNIDAZOLE (FLAGYL) 500 MG tablet Take 1 tablet (500 mg total) by mouth 2 (two) times daily. Do not drink alcohol while taking 01/29/15   [name], PA-C   BP 111/68 mmHg  Pulse 65  Temp(Src) 98 F (36.7 C) (Oral)  Resp 16  SpO2 100%  LMP 01/11/2015 (Exact Date)   Physical Exam  Constitutional: She is oriented to person, place, and time. She appears well-developed and well-nourished. No distress.  Nontoxic/nonseptic appearing  HENT:  Head: Normocephalic and atraumatic.  Eyes: Conjunctivae and EOM are normal. No scleral icterus.  Neck: Normal range of motion.  Pulmonary/Chest: Effort normal. No respiratory distress.  Respirations even and unlabored  Abdominal: Soft. She exhibits no distension. There is no tenderness. There is no rebound and no guarding.  Soft, nontender. No masses or peritoneal signs.  Genitourinary: There is no rash, tenderness, lesion or injury  on the right labia. There is no rash, tenderness, lesion or injury on the left labia. Uterus is not tender. Cervix exhibits no motion tenderness and no friability. Right adnexum displays no mass, no tenderness and no fullness. Left adnexum displays no mass, no tenderness and no fullness. No foreign body around the vagina. Vaginal discharge found.  Brown, thick  vaginal discharge appreciated in the vaginal vault. Cervix without friability. No cervical motion or adnexal tenderness. No uterine tenderness.  Musculoskeletal: Normal range of motion.  Neurological: She is alert and oriented to person, place, and time. She exhibits normal muscle tone. Coordination normal.  GCS 15. Speech is goal oriented. Patient moves extremities without ataxia. Patient ambulates with steady gait.  Skin: Skin is warm and dry. No rash noted. She is not diaphoretic. No erythema. No pallor.  Psychiatric: She has a normal mood and affect. Her behavior is normal.  Nursing note and vitals reviewed.   ED Course  Procedures (including critical care time) DIAGNOSTIC STUDIES: Oxygen Saturation is 100% on RA, normal by my interpretation.    COORDINATION OF CARE: 2:55 AM- Pt advised of plan for treatment and pt agrees.  Labs Review Labs Reviewed  WET PREP, GENITAL - Abnormal; Notable for the following:    Yeast Wet Prep HPF POC MANY (*)    Clue Cells Wet Prep HPF POC MANY (*)    WBC, Wet Prep HPF POC MANY (*)    All other components within normal limits  URINALYSIS, ROUTINE W REFLEX MICROSCOPIC - Abnormal; Notable for the following:    Hgb urine dipstick TRACE (*)    All other components within normal limits  I-STAT CHEM 8, ED - Abnormal; Notable for the following:    Potassium 3.4 (*)    BUN 5 (*)    Glucose, Bld 136 (*)    Hemoglobin 15.3 (*)    All other components within normal limits  URINE MICROSCOPIC-ADD ON  I-STAT BETA HCG BLOOD, ED ([facility], [facility], [facility] ONLY)  GC/CHLAMYDIA PROBE AMP ([place])    Imaging Review No results found.   EKG Interpretation None      MDM   Final diagnoses:  Vaginal yeast infection  Bacterial vaginosis  Metrorrhagia    34 year old female presents to the emergency department for further evaluation of brown vaginal discharge. Patient afebrile and nontoxic/nonseptic appearing. She has no palpable abdominal tenderness. No cervical  motion or adnexal tenderness on exam. There is brown discharge appreciated in the vaginal vault. No evidence of vaginal trauma.  Urinalysis today is negative for UTI. Patient concern for miscarriage; however, pregnancy test is negative. No history of positive pregnancy test prior to onset of symptoms. Wet prep shows many yeast and clue cells as well as many white blood cells. She will be treated in the ED today for vaginal yeast infection as well as bacterial vaginosis. No indication for further emergent workup at this time. Patient is sleeping comfortably and in no distress. Return precautions discussed and provided. Patient agreeable to plan with no unaddressed concerns. Patient discharged in good condition.  I personally performed the services described in this documentation, which was scribed in my presence. The recorded information has been reviewed and is accurate.   Filed Vitals:   01/28/15 2318 01/29/15 0147 01/29/15 0252  BP: 130/87 112/61 111/68  Pulse: 59 64 65  Temp: 98 F (36.7 C)    TempSrc: Oral    Resp: SpO2: 100% 100% 100%  Antony MaduraKelly Socorro Kanitz, PA-C 01/29/15 [id number]  Mirian MoMatthew Gentry, MD 02/02/15 [phone]

## 2015-01-29 LAB — WET PREP, GENITAL: Trich, Wet Prep: NONE SEEN

## 2015-01-29 LAB — URINALYSIS, ROUTINE W REFLEX MICROSCOPIC
Bilirubin Urine: NEGATIVE
Glucose, UA: NEGATIVE mg/dL
Ketones, ur: NEGATIVE mg/dL
Leukocytes, UA: NEGATIVE
NITRITE: NEGATIVE
PH: 6.5 (ref 5.0–8.0)
Protein, ur: NEGATIVE mg/dL
Specific Gravity, Urine: 1.005 (ref 1.005–1.030)
Urobilinogen, UA: 0.2 mg/dL (ref 0.0–1.0)

## 2015-01-29 LAB — URINE MICROSCOPIC-ADD ON

## 2015-01-29 LAB — I-STAT CHEM 8, ED
BUN: 5 mg/dL — ABNORMAL LOW (ref 6–23)
CHLORIDE: 102 mmol/L (ref 96–112)
CREATININE: 0.7 mg/dL (ref 0.50–1.10)
Calcium, Ion: 1.17 mmol/L (ref 1.12–1.23)
Glucose, Bld: 136 mg/dL — ABNORMAL HIGH (ref 70–99)
HEMATOCRIT: 45 % (ref 36.0–46.0)
HEMOGLOBIN: 15.3 g/dL — AB (ref 12.0–15.0)
Potassium: 3.4 mmol/L — ABNORMAL LOW (ref 3.5–5.1)
SODIUM: 138 mmol/L (ref 135–145)
TCO2: 23 mmol/L (ref 0–100)

## 2015-01-29 LAB — I-STAT BETA HCG BLOOD, ED (MC, WL, AP ONLY)

## 2015-01-29 MED ORDER — METRONIDAZOLE 500 MG PO TABS: 500.0000 mg | ORAL_TABLET | Freq: Two times a day (BID) | ORAL | Status: DC

## 2015-01-29 MED ORDER — FLUCONAZOLE 150 MG PO TABS
150.0000 mg | ORAL_TABLET | Freq: Once | ORAL | Status: AC
  Administered 2015-01-29: 150 mg via ORAL
  Filled 2015-01-29: qty 1

## 2015-01-29 NOTE — Discharge Instructions (Signed)
Take Flagyl for bacterial vaginosis. Do not drink alcohol while taking. Follow-up with women's outpatient clinic as needed for persistent symptoms.  Bacterial Vaginosis Bacterial vaginosis is a vaginal infection that occurs when the normal balance of bacteria in the vagina is disrupted. It results from an overgrowth of certain bacteria. This is the most common vaginal infection in women of childbearing age. Treatment is important to prevent complications, especially in pregnant women, as it can cause a premature delivery. CAUSES  Bacterial vaginosis is caused by an increase in harmful bacteria that are normally present in smaller amounts in the vagina. Several different kinds of bacteria can cause bacterial vaginosis. However, the reason that the condition develops is not fully understood. RISK FACTORS Certain activities or behaviors can put you at an increased risk of developing bacterial vaginosis, including:  Having a new sex partner or multiple sex partners.  Douching.  Using an intrauterine device (IUD) for contraception. Women do not get bacterial vaginosis from toilet seats, bedding, swimming pools, or contact with objects around them. SIGNS AND SYMPTOMS  Some women with bacterial vaginosis have no signs or symptoms. Common symptoms include:  Grey vaginal discharge.  A fishlike odor with discharge, especially after sexual intercourse.  Itching or burning of the vagina and vulva.  Burning or pain with urination. DIAGNOSIS  Your health care provider will take a medical history and examine the vagina for signs of bacterial vaginosis. A sample of vaginal fluid may be taken. Your health care provider will look at this sample under a microscope to check for bacteria and abnormal cells. A vaginal pH test may also be done.  TREATMENT  Bacterial vaginosis may be treated with antibiotic medicines. These may be given in the form of a pill or a vaginal cream. A second round of antibiotics may  be prescribed if the condition comes back after treatment.  HOME CARE INSTRUCTIONS   Only take over-the-counter or prescription medicines as directed by your health care provider.  If antibiotic medicine was prescribed, take it as directed. Make sure you finish it even if you start to feel better.  Do not have sex until treatment is completed.  Tell all sexual partners that you have a vaginal infection. They should see their health care provider and be treated if they have problems, such as a mild rash or itching.  Practice safe sex by using condoms and only having one sex partner. SEEK MEDICAL CARE IF:   Your symptoms are not improving after 3 days of treatment.  You have increased discharge or pain.  You have a fever. MAKE SURE YOU:   Understand these instructions.  Will watch your condition.  Will get help right away if you are not doing well or get worse. FOR MORE INFORMATION  Centers for Disease Control and Prevention, Division of STD Prevention: [employer]co.za American Sexual Health Association (ASHA): www.ashastd.org  Document Released: 09/21/2005 Document Revised: 07/12/2013 Document Reviewed: 05/03/2013 [facility] Patient Information 2015 [place], [place]. This information is not intended to replace advice given to you by your health care provider. Make sure you discuss any questions you have with your health care provider.  Metrorrhagia Metrorrhagia is bleeding from your uterus. It happens at times when you are not expecting it, like between periods. The bleeding may also last a long time. HOME CARE   Take all medicine as told by your doctor. Do not change or switch medicines without talking to your doctor first.  Take iron pills (supplements) as told by your doctor.  If you start to have trouble pooping (bowel movement), eat more fruits and vegetables.  Do not take aspirin before or during your period. Do not take medicines that have aspirin in them. Check the  label.  Rest as much as you can if you change a pad or tampon more than once in 2 hours.  Eat meals that include foods that have iron in them. These foods are:  Green leafy vegetables.  Red meat.  Liver.  Eggs.  Whole-grain breads and cereals.  Do not try to lose weight until your doctor says it is okay. GET HELP RIGHT AWAY IF:  You have a fever.  You have chills.  You feel lightheaded or pass out (faint).  You need to change your pad or tampon more than once in 1 hour.  Your bleeding is heavy.  You pass clumps of blood (clots) or tissue from your vagina.  You feel sick to your stomach (nauseous) and throw up (vomit).  You cannot keep foods down.  You feel dizzy or have watery poop (diarrhea) while taking medicine.  You have problems that you think are caused by your medicines. MAKE SURE YOU:   Understand these instructions.  Will watch your condition.  Will get help right away if you are not doing well or get worse. Document Released: 12/14/2011 Document Reviewed: 12/14/2011 [facility] Patient Information 2015 [place]. This information is not intended to replace advice given to you by your health care provider. Make sure you discuss any questions you have with your health care provider.

## 2015-01-30 ENCOUNTER — Telehealth (HOSPITAL_BASED_OUTPATIENT_CLINIC_OR_DEPARTMENT_OTHER): Payer: Self-pay | Admitting: Emergency Medicine

## 2015-01-30 NOTE — Telephone Encounter (Signed)
Chart handoff to EDP for + chlamydia for treatment plan

## 2015-01-31 ENCOUNTER — Telehealth (HOSPITAL_BASED_OUTPATIENT_CLINIC_OR_DEPARTMENT_OTHER): Payer: Self-pay | Admitting: Emergency Medicine

## 2015-02-13 ENCOUNTER — Telehealth (HOSPITAL_BASED_OUTPATIENT_CLINIC_OR_DEPARTMENT_OTHER): Payer: Self-pay | Admitting: Emergency Medicine

## 2015-02-13 NOTE — Telephone Encounter (Signed)
Lost to followup 

## 2015-02-25 LAB — GC/CHLAMYDIA PROBE AMP (~~LOC~~) NOT AT ARMC
Chlamydia: POSITIVE — AB
Neisseria Gonorrhea: NEGATIVE

## 2016-02-25 ENCOUNTER — Encounter (HOSPITAL_COMMUNITY): Payer: Self-pay | Admitting: Emergency Medicine

## 2016-02-25 ENCOUNTER — Emergency Department (HOSPITAL_COMMUNITY)
Admission: EM | Admit: 2016-02-25 | Discharge: 2016-02-25 | Disposition: A | Discharge status: Home / Self Care | Payer: Medicaid Other | Attending: Emergency Medicine | Admitting: Emergency Medicine

## 2016-02-25 DIAGNOSIS — Z792 Long term (current) use of antibiotics: Secondary | ICD-10-CM | POA: Insufficient documentation

## 2016-02-25 DIAGNOSIS — Y999 Unspecified external cause status: Secondary | ICD-10-CM | POA: Diagnosis not present

## 2016-02-25 DIAGNOSIS — W268XXA Contact with other sharp object(s), not elsewhere classified, initial encounter: Secondary | ICD-10-CM | POA: Insufficient documentation

## 2016-02-25 DIAGNOSIS — F17210 Nicotine dependence, cigarettes, uncomplicated: Secondary | ICD-10-CM | POA: Insufficient documentation

## 2016-02-25 DIAGNOSIS — S61411A Laceration without foreign body of right hand, initial encounter: Secondary | ICD-10-CM

## 2016-02-25 DIAGNOSIS — Z23 Encounter for immunization: Secondary | ICD-10-CM | POA: Insufficient documentation

## 2016-02-25 DIAGNOSIS — Z91040 Latex allergy status: Secondary | ICD-10-CM | POA: Insufficient documentation

## 2016-02-25 DIAGNOSIS — Z79899 Other long term (current) drug therapy: Secondary | ICD-10-CM | POA: Diagnosis not present

## 2016-02-25 DIAGNOSIS — Y939 Activity, unspecified: Secondary | ICD-10-CM | POA: Insufficient documentation

## 2016-02-25 DIAGNOSIS — Y929 Unspecified place or not applicable: Secondary | ICD-10-CM | POA: Diagnosis not present

## 2016-02-25 DIAGNOSIS — S61412A Laceration without foreign body of left hand, initial encounter: Secondary | ICD-10-CM | POA: Diagnosis present

## 2016-02-25 DIAGNOSIS — Z79891 Long term (current) use of opiate analgesic: Secondary | ICD-10-CM | POA: Insufficient documentation

## 2016-02-25 MED ORDER — LORAZEPAM 1 MG PO TABS
1.0000 mg | ORAL_TABLET | Freq: Once | ORAL | Status: AC
  Administered 2016-02-25: 1 mg via ORAL
  Filled 2016-02-25: qty 1

## 2016-02-25 MED ORDER — LIDOCAINE-EPINEPHRINE-TETRACAINE (LET) SOLUTION
3.0000 mL | Freq: Once | NASAL | Status: AC
  Administered 2016-02-25: 3 mL via TOPICAL
  Filled 2016-02-25: qty 3

## 2016-02-25 MED ORDER — TETANUS-DIPHTH-ACELL PERTUSSIS 5-2.5-18.5 LF-MCG/0.5 IM SUSP
0.5000 mL | Freq: Once | INTRAMUSCULAR | Status: AC
  Administered 2016-02-25: 0.5 mL via INTRAMUSCULAR
  Filled 2016-02-25: qty 0.5

## 2016-02-25 MED ORDER — LIDOCAINE HCL 1 % IJ SOLN
INTRAMUSCULAR | Status: AC
  Administered 2016-02-25: 20 mL
  Filled 2016-02-25: qty 20

## 2016-02-25 MED ORDER — LIDOCAINE HCL (PF) 1 % IJ SOLN
5.0000 mL | Freq: Once | INTRAMUSCULAR | Status: DC
  Filled 2016-02-25: qty 5

## 2016-02-25 NOTE — ED Notes (Signed)
Pt reports that she cut her hand while changing the blade of the razor that she uses to cut hair. Bleeding controlled. 3 shallow laceration noted on l/hand -l/outer side

## 2016-02-25 NOTE — Discharge Instructions (Signed)
Keep wound dry and do not remove dressing for 24 hours if possible. After that, wash gently morning and night (every 12 hours) with soap and water. Use a topical antibiotic ointment and cover with a bandaid or gauze.    Do NOT use rubbing alcohol or hydrogen peroxide, do not soak the area   Present to your primary care doctor or the urgent care of your choice, or the ED for suture removal in 8-10 days.   Every attempt was made to remove foreign body (contaminants) from the wound.  However, there is always a chance that some may remain in the wound. This can  increase your risk of infection.   If you see signs of infection (warmth, redness, tenderness, pus, sharp increase in pain, fever, red streaking in the skin) immediately return to the emergency department.   After the wound heals fully, apply sunscreen for 6-12 months to minimize scarring.    Laceration Care, Adult A laceration is a cut that goes through all layers of the skin. The cut also goes into the tissue that is right under the skin. Some cuts heal on their own. Others need to be closed with stitches (sutures), staples, skin adhesive strips, or wound glue. Taking care of your cut lowers your risk of infection and helps your cut to heal better. HOW TO TAKE CARE OF YOUR CUT For stitches or staples:  Keep the wound clean and dry.  If you were given a bandage (dressing), you should change it at least one time per day or as told by your doctor. You should also change it if it gets wet or dirty.  Keep the wound completely dry for the first 24 hours or as told by your doctor. After that time, you may take a shower or a bath. However, make sure that the wound is not soaked in water until after the stitches or staples have been removed.  Clean the wound one time each day or as told by your doctor:  Wash the wound with soap and water.  Rinse the wound with water until all of the soap comes off.  Pat the wound dry with a clean towel. Do  not rub the wound.  After you clean the wound, put a thin layer of antibiotic ointment on it as told by your doctor. This ointment:  Helps to prevent infection.  Keeps the bandage from sticking to the wound.  Have your stitches or staples removed as told by your doctor. If your doctor used skin adhesive strips:   Keep the wound clean and dry.  If you were given a bandage, you should change it at least one time per day or as told by your doctor. You should also change it if it gets dirty or wet.  Do not get the skin adhesive strips wet. You can take a shower or a bath, but be careful to keep the wound dry.  If the wound gets wet, pat it dry with a clean towel. Do not rub the wound.  Skin adhesive strips fall off on their own. You can trim the strips as the wound heals. Do not remove any strips that are still stuck to the wound. They will fall off after a while. If your doctor used wound glue:  Try to keep your wound dry, but you may briefly wet it in the shower or bath. Do not soak the wound in water, such as by swimming.  After you take a shower or a bath,  gently pat the wound dry with a clean towel. Do not rub the wound.  Do not do any activities that will make you really sweaty until the skin glue has fallen off on its own.  Do not apply liquid, cream, or ointment medicine to your wound while the skin glue is still on.  If you were given a bandage, you should change it at least one time per day or as told by your doctor. You should also change it if it gets dirty or wet.  If a bandage is placed over the wound, do not let the tape for the bandage touch the skin glue.  Do not pick at the glue. The skin glue usually stays on for 5-10 days. Then, it falls off of the skin. General Instructions  To help prevent scarring, make sure to cover your wound with sunscreen whenever you are outside after stitches are removed, after adhesive strips are removed, or when wound glue stays in  place and the wound is healed. Make sure to wear a sunscreen of at least 30 SPF.  Take over-the-counter and prescription medicines only as told by your doctor.  If you were given antibiotic medicine or ointment, take or apply it as told by your doctor. Do not stop using the antibiotic even if your wound is getting better.  Do not scratch or pick at the wound.  Keep all follow-up visits as told by your doctor. This is important.  Check your wound every day for signs of infection. Watch for:  Redness, swelling, or pain.  Fluid, blood, or pus.  Raise (elevate) the injured area above the level of your heart while you are sitting or lying down, if possible. GET HELP IF:  You got a tetanus shot and you have any of these problems at the injection site:  Swelling.  Very bad pain.  Redness.  Bleeding.  You have a fever.  A wound that was closed breaks open.  You notice a bad smell coming from your wound or your bandage.  You notice something coming out of the wound, such as wood or glass.  Medicine does not help your pain.  You have more redness, swelling, or pain at the site of your wound.  You have fluid, blood, or pus coming from your wound.  You notice a change in the color of your skin near your wound.  You need to change the bandage often because fluid, blood, or pus is coming from the wound.  You start to have a new rash.  You start to have numbness around the wound. GET HELP RIGHT AWAY IF:  You have very bad swelling around the wound.  Your pain suddenly gets worse and is very bad.  You notice painful lumps near the wound or on skin that is anywhere on your body.  You have a red streak going away from your wound.  The wound is on your hand or foot and you cannot move a finger or toe like you usually can.  The wound is on your hand or foot and you notice that your fingers or toes look pale or bluish.   This information is not intended to replace advice  given to you by your health care provider. Make sure you discuss any questions you have with your health care provider.   Document Released: 03/09/2008 Document Revised: 02/05/2015 Document Reviewed: 09/17/2014 [employer] Interactive Patient Education [employer]

## 2016-02-25 NOTE — ED Provider Notes (Signed)
CSN: [id number]     Arrival date & time 02/25/16  1512 History  By signing my name below, I, Marisue Horn, attest that this documentation has been prepared under the direction and in the presence of non-physician practitioner, Wynetta Emery, PA-C. Electronically Signed: Marisue Horn, Scribe. 02/25/2016. 6:11 PM.  Chief Complaint  Patient presents with  . Extremity Laceration    laceration on l/hand   The history is provided by the patient. No language interpreter was used.   HPI Comments:  ROSAISELA Horn is a 35 y.o. female who presents to the Emergency Department complaining of 3 small lacerations to left hand. Pt accidentally cut her hand while changing the blade of a razor she uses to cut hair. No alleviating factors noted or treatments attempted PTA. Pt is left handed. She is unsure of last tetanus. Denies numbness in hand or fingers.  History reviewed. No pertinent past medical history. History reviewed. No pertinent past surgical history. History reviewed. No pertinent family history. Social History  Substance Use Topics  . Smoking status: Current Every Day Smoker    Types: Cigarettes  . Smokeless tobacco: None  . Alcohol Use: No   OB History    No data available     Review of Systems  10 systems reviewed and all are negative for acute change except as noted in the HPI.  Allergies  Latex and Penicillins  Home Medications   Prior to Admission medications   Medication Sig Start Date End Date Taking? Authorizing Provider  cephALEXin (KEFLEX) 500 MG capsule Take 1 capsule (500 mg total) by mouth 4 (four) times daily. Patient not taking: Reported on 01/29/2015 05/05/14   [name], PA-C  cyclobenzaprine (FLEXERIL) 10 MG tablet Take 1 tablet (10 mg total) by mouth 2 (two) times daily as needed for muscle spasms. Patient not taking: Reported on 01/29/2015 10/05/14   [name], PA-C  HYDROcodone-acetaminophen (NORCO/VICODIN) 5-325 MG per tablet Take 1-2  tablets by mouth every 4 (four) hours as needed for moderate pain or severe pain. Patient not taking: Reported on 01/29/2015 10/05/14   [name], PA-C  metroNIDAZOLE (FLAGYL) 500 MG tablet Take 1 tablet (500 mg total) by mouth 2 (two) times daily. Do not drink alcohol while taking 01/29/15   [name], PA-C   BP 112/83 mmHg  Pulse 77  Temp(Src) 97.6 F (36.4 C) (Oral)  Resp 16  Wt 116 lb (52.617 kg)  SpO2 100%  LMP 01/23/2016 (Exact Date)   Physical Exam  Constitutional: She is oriented to person, place, and time. She appears well-developed and well-nourished. No distress.  HENT:  Head: Normocephalic.  Eyes: Conjunctivae and EOM are normal.  Cardiovascular: Normal rate.   Pulmonary/Chest: Effort normal. No stridor.  Musculoskeletal: Normal range of motion.       Hands: 2 1 cm full thickness lacerations to 4th digit and ulnar aspect at the mid 5th metacarpal 2 cm full thickness laceration to 5th digit   Neurological: She is alert and oriented to person, place, and time.  Skin:  Neurovacularly intact with full ROM and strength to each interphalangeal joint (tested in isolation) in both flexion and extension.    Psychiatric: She has a normal mood and affect.  Nursing note and vitals reviewed.   ED Course  Procedures  DIAGNOSTIC STUDIES:  Oxygen Saturation is 100% on RA, normal by my interpretation.    COORDINATION OF CARE:  4:25 PM Will apply topical numbing, update Tetanus and repair lacerations. Discussed treatment plan with  pt at bedside and pt agreed to plan.  6:10 PM Gave pt instructions regarding wound care. Instructed pt to return in 8-10 days for suture removal.  LACERATION REPAIR Performed by: Wynetta EmeryNicole Adanya Sosinski, PA-C Consent: Verbal consent obtained. Risks and benefits: risks, benefits and alternatives were discussed Patient identity confirmed: provided demographic data Time out performed prior to procedure Prepped and Draped in normal sterile  fashion Wound explored Laceration Location: 4th digit Laceration Length: 1 cm No Foreign Bodies seen or palpated Anesthesia: local infiltration Local anesthetic: lidocaine 1% Anesthetic total: 2 ml Irrigation method: syringe Amount of cleaning: standard Skin closure: 6'o Ethilon Number of sutures or staples: 4 Technique: running locking Patient tolerance: Patient tolerated the procedure well with no immediate complications.  LACERATION REPAIR Performed by: Wynetta EmeryNicole Lonell Stamos, PA-C Consent: Verbal consent obtained. Risks and benefits: risks, benefits and alternatives were discussed Patient identity confirmed: provided demographic data Time out performed prior to procedure Prepped and Draped in normal sterile fashion Wound explored Laceration Location: 5th digit Laceration Length: 2 cm No Foreign Bodies seen or palpated Anesthesia: local infiltration Local anesthetic: lidocaine 1%  Anesthetic total: 4 ml Irrigation method: syringe Amount of cleaning: standard Skin closure: 6'o Ethilon Number of sutures or staples: 7 running locking with 1 simple interrupted Patient tolerance: Patient tolerated the procedure well with no immediate complications.  LACERATION REPAIR Performed by: Wynetta EmeryNicole [name], PA-C Consent: Verbal consent obtained. Risks and benefits: risks, benefits and alternatives were discussed Patient identity confirmed: provided demographic data Time out performed prior to procedure Prepped and Draped in normal sterile fashion Wound explored Laceration Location: ulnar aspect at the mid 5th metacarpal Laceration Length: 1cm No Foreign Bodies seen or palpated Anesthesia: local infiltration Local anesthetic: lidocaine 1%  Anesthetic total: 2 ml Irrigation method: syringe Amount of cleaning: standard Skin closure: 6'o Ethilon Number of sutures or staples: 3 Technique: running locking Patient tolerance: Patient tolerated the procedure well with no immediate  complications.   MDM   Final diagnoses:  None    Filed Vitals:   02/25/16 1608 02/25/16 1822  BP: 112/83   Pulse: 77 64  Temp: 97.6 F (36.4 C)   TempSrc: Oral   Resp: 16 16  Weight: 52.617 kg   SpO2: 100%     Medications  lidocaine (PF) (XYLOCAINE) 1 % injection 5 mL (not administered)  lidocaine-EPINEPHrine-tetracaine (LET) solution (3 mLs Topical Given 02/25/16 1642)  LORazepam (ATIVAN) tablet 1 mg (1 mg Oral Given 02/25/16 1641)  lidocaine (XYLOCAINE) 1 % (with pres) injection (20 mLs  Given 02/25/16 1642)  lidocaine-EPINEPHrine-tetracaine (LET) solution (3 mLs Topical Given 02/25/16 1704)  Tdap (BOOSTRIX) injection 0.5 mL (0.5 mLs Intramuscular Given 02/25/16 1820)    Rachel Horn is 35 y.o. female presenting with Laceration to left (dominant) hand occurring just prior to arrival, no tenderness involvement. Patient has a fear of needles, she is given Ativan. Wound is cleaned and closed with 3 separate laceration repair. Counseled patient on wound care and return precautions.  Evaluation does not show pathology that would require ongoing emergent intervention or inpatient treatment. Pt is hemodynamically stable and mentating appropriately. Discussed findings and plan with patient/guardian, who agrees with care plan. All questions answered. Return precautions discussed and outpatient follow up given.    I personally performed the services described in this documentation, which was scribed in my presence. The recorded information has been reviewed and is accurate.    Wynetta Emeryicole Madia Carvell, PA-C 02/25/16 2023  Loren Raceravid Yelverton, MD 02/25/16 2218

## 2017-06-30 ENCOUNTER — Emergency Department (HOSPITAL_COMMUNITY): Payer: Self-pay

## 2017-06-30 ENCOUNTER — Emergency Department (HOSPITAL_COMMUNITY)
Admission: EM | Admit: 2017-06-30 | Discharge: 2017-06-30 | Disposition: A | Discharge status: Home / Self Care | Payer: Self-pay | Attending: Emergency Medicine | Admitting: Emergency Medicine

## 2017-06-30 ENCOUNTER — Encounter (HOSPITAL_COMMUNITY): Payer: Self-pay | Admitting: Family Medicine

## 2017-06-30 DIAGNOSIS — F17210 Nicotine dependence, cigarettes, uncomplicated: Secondary | ICD-10-CM | POA: Insufficient documentation

## 2017-06-30 DIAGNOSIS — Z91040 Latex allergy status: Secondary | ICD-10-CM | POA: Insufficient documentation

## 2017-06-30 DIAGNOSIS — F419 Anxiety disorder, unspecified: Secondary | ICD-10-CM | POA: Insufficient documentation

## 2017-06-30 MED ORDER — HYDROXYZINE HCL 25 MG PO TABS: 25.0000 mg | ORAL_TABLET | Freq: Every evening | ORAL | 0 refills | Status: DC | PRN

## 2017-06-30 NOTE — ED Provider Notes (Signed)
[facility]-EMERGENCY DEPT Provider Note   CSN: [id number] Arrival date & time: 06/30/17  0345   History   Chief Complaint Chief Complaint  Patient presents with  . Shortness of Breath  . Anxiety    HPI Rachel Horn is a 36 y.o. female presenting with shortness of breath while trying to fall asleep last night prompting her to come to ED. She reports this feels like a panic attack which she used to have when she was a teenager. She "felt like I was taking my last breath" and had chest tightness. She took several deep breaths and walked outside which helped. She denies current symptoms, no SOB or chest pain. She denies upper respiratory symptoms, fevers, chills as well. 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 She feels this is stressful and may be contributing to panic attacks.   HPI  History reviewed. No pertinent past medical history.  There are no active problems to display for this patient.   History reviewed. No pertinent surgical history.  OB History    No data available       Home Medications    Prior to Admission medications   Medication Sig Start Date End Date Taking? Authorizing Provider  hydroxypropyl methylcellulose / hypromellose (ISOPTO TEARS / GONIOVISC) 2.5 % ophthalmic solution Place 1 drop into both eyes 3 (three) times daily as needed for dry eyes.   Yes [provider]  ibuprofen (ADVIL,MOTRIN) 200 MG tablet Take 200 mg by mouth every 6 (six) hours as needed for moderate pain.   Yes [provider]    Family History History reviewed. No pertinent family history.  Social History Social History  Substance Use Topics  . Smoking status: Current Every Day Smoker    Packs/day: 1.00    Types: Cigarettes  . Smokeless tobacco: Never Used  . Alcohol use No     Allergies   Latex and Penicillins   Review of Systems Review of Systems  Constitutional: Negative for chills and fever.  HENT:  Negative for congestion, rhinorrhea and sore throat.   Respiratory: Positive for chest tightness and shortness of breath.   Cardiovascular: Negative for chest pain, palpitations and leg swelling.  Gastrointestinal: Negative for abdominal pain.  Genitourinary: Negative for dysuria.  Musculoskeletal: Negative for back pain and myalgias.  Skin: Negative for rash.  Neurological: Negative for dizziness, weakness, light-headedness and headaches.  Psychiatric/Behavioral: The patient is nervous/anxious.      Physical Exam Updated Vital Signs BP (!) 127/95 (BP Location: Left Arm)   Pulse 75   Temp 97.6 F (36.4 C) (Oral)   Resp 16   Ht  (1.626 m)   Wt 52.6 kg (116 lb)   LMP 06/14/2017   SpO2 100%   BMI 19.91 kg/m   Physical Exam  Constitutional: She is oriented to person, place, and time. She appears well-developed and well-nourished. No distress.  HENT:  Head: Normocephalic and atraumatic.  Nose: Nose normal.  Mouth/Throat: Oropharynx is clear and moist.  Eyes: Pupils are equal, round, and reactive to light. EOM are normal.  Neck: Normal range of motion. Neck supple.  Cardiovascular: Normal rate, regular rhythm and intact distal pulses.   No murmur heard. Pulmonary/Chest: Effort normal and breath sounds normal. No respiratory distress.  Abdominal: Soft. She exhibits no distension. There is no tenderness.  Musculoskeletal: Normal range of motion. She exhibits no edema, tenderness or deformity.  Lymphadenopathy:    She  has no cervical adenopathy.  Neurological: She is alert and oriented to person, place, and time. She exhibits normal muscle tone.  Skin: Skin is warm and dry. No rash noted.  Psychiatric: She has a normal mood and affect. Her behavior is normal. Judgment and thought content normal.   ED Treatments / Results  Labs (all labs ordered are listed, but only abnormal results are displayed) Labs Reviewed - No data to display  EKG  EKG  Interpretation  Date/Time:  Wednesday June 30 2017 05:38:43 EDT Ventricular Rate:  60 PR Interval:    QRS Duration: 81 QT Interval:  402 QTC Calculation: 402 R Axis:   87 Text Interpretation:  Sinus rhythm Normal ECG Confirmed by Geoffery Lyons ([id number]) on 06/30/2017 6:14:32 AM       Radiology Dg Chest 2 View  Result Date: 06/30/2017 CLINICAL DATA:  Shortness of breath, progressive over 2 days. Chest tightness. EXAM: CHEST  2 VIEW COMPARISON:  Radiograph 06/30/2012 FINDINGS: The cardiomediastinal contours are normal. The lungs are clear. Pulmonary vasculature is normal. No consolidation, pleural effusion, or pneumothorax. No acute osseous abnormalities are seen. IMPRESSION: Unremarkable radiographs of the chest. Electronically Signed   By: Rubye Oaks M.D.   On: 06/30/2017 06:18    Procedures Procedures (including critical care time)  Medications Ordered in ED Medications - No data to display   Initial Impression / Assessment and Plan / ED Course  I have reviewed the triage vital signs and the nursing notes.  Pertinent labs & imaging results that were available during my care of the patient were reviewed by me and considered in my medical decision making (see chart for details).     Well appearing 36 year old female presenting with episodes of shortness of breath likely related to anxiety. Chest x-ray and EKG normal. No concerning exam findings. Stable vital signs. PERC negative. Patient felt improved in ED without any intervention. Provided work excuse and prescription for Atarax to use as needed for sleep. Stable for discharge home. Advised follow up with PCP to discuss anxiety and possible interventions. Patient verbalized understanding and agreement with plan.   Final Clinical Impressions(s) / ED Diagnoses   Final diagnoses:  Anxiety    New Prescriptions Current Discharge Medication List       Tillman Sers, DO 06/30/17 0753    Tillman Sers,  DO 06/30/17 5284    Shaune Pollack, MD 07/04/17 1945

## 2017-06-30 NOTE — ED Triage Notes (Signed)
Patient reports she is experiencing shortness of breath that started 2 nights ago. Patient appears in no acute distress. Respirations are even, regular, and unlabored. Clear breath sounds. Denies fever or cough. Patient has been using cell phone while triaging.

## 2018-02-15 ENCOUNTER — Ambulatory Visit: Payer: Self-pay | Admitting: Physician Assistant

## 2018-02-15 ENCOUNTER — Ambulatory Visit: Payer: Self-pay

## 2018-02-15 NOTE — Progress Notes (Unsigned)
  Tuberculosis Risk Questionnaire  1. No Were you born outside the [place] in one of the following parts of the world: [place], [place], [place], [place] or [place]?    2. No Have you traveled outside the [place] and lived for more than one month in one of the following parts of the world: [place], [place], [place], [place] or [place]?    3. No Do you have a compromised immune system such as from any of the following conditions:HIV/AIDS, organ or bone marrow transplantation, diabetes, immunosuppressive medicines (e.g. Prednisone, Remicaide), leukemia, lymphoma, cancer of the head or neck, gastrectomy or jejunal bypass, end-stage renal disease (on dialysis), or silicosis?     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 Have you ever or do you plan on working in: a residential care center, a health care facility, a jail or prison or homeless shelter?    5. No Have you ever: injected illegal drugs, used crack cocaine, lived in a homeless shelter  or been in jail or prison?     6. No Have you ever been exposed to anyone with infectious tuberculosis?  7. No Have you ever had a BCG vaccine? (BCG is a vaccine for tuberculosis  (TB) used in OTHER countries, NOT in the [place]).  8. No Have you ever been advised by a health care provider NOT to have a TB skin test?  9. No Have you ever had a POSITIVE TB skin test?  IF SO, when? n/a  IF SO, were you treated with INH? n/a  IF SO, where? n/a  Tuberculosis Symptom Questionnaire  Do you currently have any of the following symptoms?  1. No Unexplained cough lasting more than 3 weeks?   2. No Unexplained fever lasting more than 3 weeks.   3. No Night Sweats (sweating that leaves the bedclothes and sheets wet)     4. No Shortness of Breath   5. No Chest Pain   6. No Unintentional weight loss    7. No Unexplained fatigue (very tired for no reason)

## 2018-02-16 ENCOUNTER — Ambulatory Visit: Payer: Self-pay | Admitting: Physician Assistant

## 2018-02-16 ENCOUNTER — Encounter: Payer: Self-pay | Admitting: Physician Assistant

## 2018-02-16 ENCOUNTER — Other Ambulatory Visit: Payer: Self-pay

## 2018-02-16 VITALS — BP 116/82 | HR 83 | Temp 98.0°F | Resp 16 | Ht 64.37 in | Wt 116.0 lb

## 2018-02-16 DIAGNOSIS — Z9289 Personal history of other medical treatment: Secondary | ICD-10-CM

## 2018-02-16 DIAGNOSIS — Z111 Encounter for screening for respiratory tuberculosis: Secondary | ICD-10-CM

## 2018-02-16 DIAGNOSIS — R7611 Nonspecific reaction to tuberculin skin test without active tuberculosis: Secondary | ICD-10-CM

## 2018-02-16 NOTE — Progress Notes (Signed)
   Patient ID: Rachel Horn, female    DOB: Nov 07, 1980, 37 y.o.   MRN: [id number]  PCP: Patient, No Pcp Per  Chief Complaint  Patient presents with  . PPD Placement    Subjective:   Presents for TB screening.  History from the patient: In 2006, had a positive skin test, and she lost her job due to the positive test. 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 She was unaware that she was pregnant at the time.  Spoke with Tammy at the [facility]. 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  It appears the patient took 30 days and did not follow-up. Recommended screening going forward: If asymptomatic, provide letter documenting no active disease at this time.  If develops symptoms, reevaluate with chest radiograph.  QuantiFERON gold is also an option, though more costly.  She is asymptomatic. +tobacco. Thinking about quitting.  Review of Systems  Constitutional: Negative for activity change, appetite change, chills, diaphoresis, fatigue, fever and unexpected weight change.  Respiratory: Negative for cough and shortness of breath.   Cardiovascular: Negative for chest pain.  Skin: Negative for rash.       There are no active problems to display for this patient.   Allergies  Allergen Reactions  . Latex Swelling and Rash  . Penicillins Swelling and Rash    Prior to Admission medications   Medication Sig Start Date End Date Taking? Authorizing Provider  hydroxypropyl methylcellulose / hypromellose (ISOPTO TEARS / GONIOVISC) 2.5 % ophthalmic solution Place 1 drop into both eyes 3 (three) times daily as needed for dry eyes.   Yes [provider]  hydrOXYzine (ATARAX/VISTARIL) 25 MG tablet Take 1 tablet (25 mg total) by mouth at bedtime as needed. 06/30/17  Yes Riccio, Angela C, DO  ibuprofen (ADVIL,MOTRIN) 200 MG tablet Take 200 mg by mouth every 6 (six) hours as needed for moderate pain.   Yes [provider]     Past Medical, Surgical Family  and Social History reviewed and updated.        Objective:  Physical Exam  Constitutional: She is oriented to person, place, and time. She appears well-developed and well-nourished. She is active and cooperative. No distress.  BP 116/82   Pulse 83   Temp 98 F (36.7 C) (Oral)   Resp 16   Ht 5' 4.37" (1.635 m)   Wt 116 lb (52.6 kg)   SpO2 95%   BMI 19.68 kg/m    Eyes: Conjunctivae are normal.  Pulmonary/Chest: Effort normal.  Neurological: She is alert and oriented to person, place, and time.  Psychiatric: She has a normal mood and affect. Her speech is normal and behavior is normal.   TB screening questions performed yesterday by phone are reviewed.  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        Assessment & Plan:  1. Screening-pulmonary TB 2. History of positive PPD No evidence of active TB.  Letter provided.  Educated patient regarding resources available at the local health department.  If her employer requires QuantiFERON gold, she may obtain that here.    Return for re-evaluation as needed.   Fernande Bras, PA-C Primary Care at [facility]

## 2018-02-16 NOTE — Patient Instructions (Addendum)
If your employer requires the blood test (Quantiferon Gold), come back here for that test (it can be done as a LAB ONLY visit).  You can schedule a visit annually at the Health Department and have the screening for FREE. If you develop symptoms, you should be evaluated for those: UNEXPLAINED cough, fever, fatigue, night sweats lasting longer than 3 weeks.    IF you received an x-ray today, you will receive an invoice from [facility]. Please contact [facility] at [phone] with questions or concerns regarding your invoice.   IF you received labwork today, you will receive an invoice from [facility]. Please contact [facility] at [phone] with questions or concerns regarding your invoice.   Our billing staff will not be able to assist you with questions regarding bills from these companies.  You will be contacted with the lab results as soon as they are available. The fastest way to get your results is to activate your My Chart account. Instructions are located on the last page of this paperwork. If you have not heard from [place] regarding the results in 2 weeks, please contact this office.

## 2018-02-19 DIAGNOSIS — Z9289 Personal history of other medical treatment: Secondary | ICD-10-CM | POA: Insufficient documentation

## 2018-11-01 ENCOUNTER — Emergency Department (HOSPITAL_COMMUNITY)
Admission: EM | Admit: 2018-11-01 | Discharge: 2018-11-01 | Disposition: A | Discharge status: Home / Self Care | Payer: Medicaid Other | Attending: Emergency Medicine | Admitting: Emergency Medicine

## 2018-11-01 ENCOUNTER — Emergency Department (HOSPITAL_COMMUNITY): Admission: EM | Admit: 2018-11-01 | Discharge: 2018-11-01 | Discharge status: Against Medical Advice | Payer: Self-pay

## 2018-11-01 DIAGNOSIS — N939 Abnormal uterine and vaginal bleeding, unspecified: Secondary | ICD-10-CM | POA: Insufficient documentation

## 2018-11-01 DIAGNOSIS — Z5321 Procedure and treatment not carried out due to patient leaving prior to being seen by health care provider: Secondary | ICD-10-CM | POA: Insufficient documentation

## 2018-11-01 LAB — CBC
HEMATOCRIT: 41.7 % (ref 36.0–46.0)
Hemoglobin: 13.4 g/dL (ref 12.0–15.0)
MCH: 28.8 pg (ref 26.0–34.0)
MCHC: 32.1 g/dL (ref 30.0–36.0)
MCV: 89.5 fL (ref 80.0–100.0)
Platelets: 264 10*3/uL (ref 150–400)
RBC: 4.66 MIL/uL (ref 3.87–5.11)
RDW: 13.6 % (ref 11.5–15.5)
WBC: 6 10*3/uL (ref 4.0–10.5)
nRBC: 0 % (ref 0.0–0.2)

## 2018-11-01 LAB — URINALYSIS, ROUTINE W REFLEX MICROSCOPIC
Bacteria, UA: NONE SEEN
Bilirubin Urine: NEGATIVE
Glucose, UA: NEGATIVE mg/dL
KETONES UR: 5 mg/dL — AB
LEUKOCYTES UA: NEGATIVE
Nitrite: NEGATIVE
Protein, ur: 30 mg/dL — AB
RBC / HPF: >50 RBC/hpf — ABNORMAL HIGH (ref 0–5)
Specific Gravity, Urine: 1.017 (ref 1.005–1.030)
pH: 6 (ref 5.0–8.0)

## 2018-11-01 LAB — COMPREHENSIVE METABOLIC PANEL
ALBUMIN: 4.1 g/dL (ref 3.5–5.0)
ALT: 10 U/L (ref 0–44)
AST: 14 U/L — AB (ref 15–41)
Alkaline Phosphatase: 54 U/L (ref 38–126)
Anion gap: 10 (ref 5–15)
BUN: 5 mg/dL — AB (ref 6–20)
CHLORIDE: 104 mmol/L (ref 98–111)
CO2: 24 mmol/L (ref 22–32)
Calcium: 9.4 mg/dL (ref 8.9–10.3)
Creatinine, Ser: 0.67 mg/dL (ref 0.44–1.00)
GFR calc Af Amer: >60 mL/min (ref >60)
GFR calc non Af Amer: >60 mL/min (ref >60)
Glucose, Bld: 91 mg/dL (ref 70–99)
Potassium: 3.9 mmol/L (ref 3.5–5.1)
SODIUM: 138 mmol/L (ref 135–145)
Total Bilirubin: 0.6 mg/dL (ref 0.3–1.2)
Total Protein: 7.2 g/dL (ref 6.5–8.1)

## 2018-11-01 LAB — I-STAT BETA HCG BLOOD, ED (MC, WL, AP ONLY): I-stat hCG, quantitative: 946.1 m[IU]/mL — ABNORMAL HIGH (ref <5)

## 2018-11-01 LAB — LIPASE, BLOOD: Lipase: 27 U/L (ref 11–51)

## 2018-11-01 MED ORDER — SODIUM CHLORIDE 0.9% FLUSH: 3.0000 mL | Freq: Once | INTRAVENOUS | Status: DC

## 2018-11-01 NOTE — ED Notes (Signed)
No answer x 1 for triage

## 2018-11-01 NOTE — ED Notes (Signed)
No answer for triage x2 

## 2018-11-01 NOTE — ED Triage Notes (Signed)
Pt here with heavy vaginal bleeding and abd pain for last 10 days. Said menstrual period started 10 days ago. Still bleeding and having clots.  Ambulatory to triage.

## 2018-11-01 NOTE — ED Notes (Signed)
Pt was walking out of the lobby pt was notified that she has a room. Pt said "whatever," and walked out the front doors to the parking lot.

## 2018-11-01 NOTE — ED Notes (Signed)
No answer for triage x3 

## 2018-11-22 ENCOUNTER — Emergency Department (HOSPITAL_COMMUNITY): Payer: Self-pay

## 2018-11-22 ENCOUNTER — Other Ambulatory Visit: Payer: Self-pay

## 2018-11-22 ENCOUNTER — Encounter (HOSPITAL_COMMUNITY): Payer: Self-pay

## 2018-11-22 ENCOUNTER — Emergency Department (HOSPITAL_COMMUNITY)
Admission: EM | Admit: 2018-11-22 | Discharge: 2018-11-23 | Disposition: A | Discharge status: Home / Self Care | Payer: Self-pay | Attending: Emergency Medicine | Admitting: Emergency Medicine

## 2018-11-22 DIAGNOSIS — R079 Chest pain, unspecified: Secondary | ICD-10-CM | POA: Insufficient documentation

## 2018-11-22 DIAGNOSIS — Z5321 Procedure and treatment not carried out due to patient leaving prior to being seen by health care provider: Secondary | ICD-10-CM | POA: Insufficient documentation

## 2018-11-22 LAB — CBC
HCT: 41.9 % (ref 36.0–46.0)
Hemoglobin: 13.2 g/dL (ref 12.0–15.0)
MCH: 27.3 pg (ref 26.0–34.0)
MCHC: 31.5 g/dL (ref 30.0–36.0)
MCV: 86.7 fL (ref 80.0–100.0)
Platelets: 236 10*3/uL (ref 150–400)
RBC: 4.83 MIL/uL (ref 3.87–5.11)
RDW: 13.1 % (ref 11.5–15.5)
WBC: 10.1 10*3/uL (ref 4.0–10.5)
nRBC: 0 % (ref 0.0–0.2)

## 2018-11-22 LAB — I-STAT BETA HCG BLOOD, ED (MC, WL, AP ONLY): I-stat hCG, quantitative: 18.8 m[IU]/mL — ABNORMAL HIGH (ref <5)

## 2018-11-22 LAB — I-STAT TROPONIN, ED: Troponin i, poc: 0 ng/mL (ref 0.00–0.08)

## 2018-11-22 LAB — BASIC METABOLIC PANEL
Anion gap: 16 — ABNORMAL HIGH (ref 5–15)
BUN: <5 mg/dL — ABNORMAL LOW (ref 6–20)
CO2: 23 mmol/L (ref 22–32)
Calcium: 9.1 mg/dL (ref 8.9–10.3)
Chloride: 97 mmol/L — ABNORMAL LOW (ref 98–111)
Creatinine, Ser: 0.71 mg/dL (ref 0.44–1.00)
GFR calc Af Amer: >60 mL/min (ref >60)
GFR calc non Af Amer: >60 mL/min (ref >60)
Glucose, Bld: 103 mg/dL — ABNORMAL HIGH (ref 70–99)
Potassium: 3.1 mmol/L — ABNORMAL LOW (ref 3.5–5.1)
Sodium: 136 mmol/L (ref 135–145)

## 2018-11-22 MED ORDER — ACETAMINOPHEN 325 MG PO TABS
650.0000 mg | ORAL_TABLET | Freq: Once | ORAL | Status: AC | PRN
  Administered 2018-11-22: 650 mg via ORAL
  Filled 2018-11-22: qty 2

## 2018-11-22 MED ORDER — SODIUM CHLORIDE 0.9% FLUSH: 3.0000 mL | Freq: Once | INTRAVENOUS | Status: DC

## 2018-11-22 NOTE — ED Triage Notes (Signed)
Pt here having central chest pain for the last month, also having mid thoracic back pain.  Took tylenol at 8 am for pain, no relief.

## 2018-11-23 ENCOUNTER — Emergency Department (HOSPITAL_COMMUNITY): Payer: Self-pay

## 2018-11-23 ENCOUNTER — Telehealth: Payer: Self-pay | Admitting: Family Medicine

## 2018-11-23 ENCOUNTER — Emergency Department (HOSPITAL_COMMUNITY)
Admission: EM | Admit: 2018-11-23 | Discharge: 2018-11-23 | Disposition: A | Discharge status: Home / Self Care | Payer: Self-pay | Attending: Emergency Medicine | Admitting: Emergency Medicine

## 2018-11-23 ENCOUNTER — Encounter (HOSPITAL_COMMUNITY): Payer: Self-pay

## 2018-11-23 DIAGNOSIS — N9489 Other specified conditions associated with female genital organs and menstrual cycle: Secondary | ICD-10-CM | POA: Insufficient documentation

## 2018-11-23 DIAGNOSIS — Z91040 Latex allergy status: Secondary | ICD-10-CM | POA: Insufficient documentation

## 2018-11-23 DIAGNOSIS — F17210 Nicotine dependence, cigarettes, uncomplicated: Secondary | ICD-10-CM | POA: Insufficient documentation

## 2018-11-23 DIAGNOSIS — R0789 Other chest pain: Secondary | ICD-10-CM

## 2018-11-23 DIAGNOSIS — R079 Chest pain, unspecified: Secondary | ICD-10-CM | POA: Insufficient documentation

## 2018-11-23 DIAGNOSIS — R102 Pelvic and perineal pain: Secondary | ICD-10-CM | POA: Insufficient documentation

## 2018-11-23 DIAGNOSIS — N939 Abnormal uterine and vaginal bleeding, unspecified: Secondary | ICD-10-CM | POA: Insufficient documentation

## 2018-11-23 LAB — HCG, QUANTITATIVE, PREGNANCY: hCG, Beta Chain, Quant, S: 9 m[IU]/mL — ABNORMAL HIGH (ref <5)

## 2018-11-23 MED ORDER — ACETAMINOPHEN 325 MG PO TABS: 650.0000 mg | ORAL_TABLET | Freq: Once | ORAL | Status: DC | PRN

## 2018-11-23 MED ORDER — ACETAMINOPHEN 500 MG PO TABS
1000.0000 mg | ORAL_TABLET | Freq: Once | ORAL | Status: AC
  Administered 2018-11-23: 1000 mg via ORAL
  Filled 2018-11-23: qty 2

## 2018-11-23 NOTE — ED Triage Notes (Signed)
Pt states she has had reoccurring chest pain since Jan 27. Pt states she has had vomiting and some light headedness.

## 2018-11-23 NOTE — ED Provider Notes (Signed)
I received this patient in signout from Dr. Eudelia Bunch.  He had evaluated the patient for atypical chest pain and chest pain work-up was reassuring.  We were awaiting quantitative hCG which was 9.  Patient has had several weeks of vaginal bleeding.  Transvaginal ultrasound shows hypoechoic area in endometrial cavity, differential including early or uterine gestation, spontaneous abortion, or ectopic pregnancy.  Based on her history, spontaneous abortion seems most likely and her hormone level just has not completely normalized.  I discussed with OB/GYN on-call, Dr. Alvester Morin, appreciate assistance.  She has recommended return to OB/GYN clinic in 2 days for repeat hCG level.  I emphasized the importance of follow-up with patient and extensively reviewed return precautions.  She voiced understanding.   Oleg Oleson, Ambrose Finland, MD 11/23/18 1152

## 2018-11-23 NOTE — ED Provider Notes (Signed)
[facility]-EMERGENCY DEPT Provider Note  CSN: [id number] Arrival date & time: 11/23/18 0015  Chief Complaint(s) Chest Pain  HPI Rachel Horn is a 38 y.o. female who presents to the emergency department with approximately 1 month of intermittent sternal chest pain, described as aching and sharp, exacerbated with certain positions.  She reports that over the last week the pain has been constant.  Pain is nonradiating and nonexertional.  She does report that over the past week she has had runny nose, nasal congestion and cough.  She denies any nausea or vomiting.  She is endorsing intermittent abdominal cramping in 1 month of vaginal bleeding.  Reports that she went to [facility] last night but left prior to getting the results of the work-up.   HPI  Past Medical History History reviewed. No pertinent past medical history. Patient Active Problem List   Diagnosis Date Noted  . 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 Authorizing Provider  ibuprofen (ADVIL,MOTRIN) 200 MG tablet Take 200 mg by mouth every 6 (six) hours as needed for moderate pain.   Yes [provider]  hydrOXYzine (ATARAX/VISTARIL) 25 MG tablet Take 1 tablet (25 mg total) by mouth at bedtime as needed. Patient not taking: Reported on 11/23/2018 06/30/17   Tillman Sers, DO                                                                                                                                    Past Surgical History History reviewed. No pertinent surgical history. Family History No family history on file.  Social History Social History   Tobacco Use  . Smoking status: Current Every Day Smoker    Packs/day: 1.00    Types: Cigarettes  . Smokeless tobacco: Never Used  Substance Use Topics  . Alcohol use: No  . Drug use: No   Allergies Latex and Penicillins  Review of Systems Review  of Systems All other systems are reviewed and are negative for acute change except as noted in the HPI  Physical Exam Vital Signs  I have reviewed the triage vital signs BP 125/85 (BP Location: Right Arm)   Pulse 99   Temp 98.8 F (37.1 C) (Oral)   Resp 20   Ht 5\' 4"  (1.626 m)   Wt 52.6 kg   LMP 11/23/2018 (LMP Unknown)   SpO2 100%   BMI 19.91 kg/m   Physical Exam Vitals signs reviewed.  Constitutional:      General: She is not in acute distress.    Appearance: She is well-developed. She is not diaphoretic.  HENT:     Head: Normocephalic and atraumatic.     Nose: Nose normal.  Eyes:     General: No scleral icterus.       Right eye: No discharge.  Left eye: No discharge.     Conjunctiva/sclera: Conjunctivae normal.     Pupils: Pupils are equal, round, and reactive to light.  Neck:     Musculoskeletal: Normal range of motion and neck supple.  Cardiovascular:     Rate and Rhythm: Normal rate and regular rhythm.     Heart sounds: No murmur. No friction rub. No gallop.   Pulmonary:     Effort: Pulmonary effort is normal. No respiratory distress.     Breath sounds: Normal breath sounds. No stridor. No rales.  Chest:     Chest wall: Tenderness present.    Abdominal:     General: There is no distension.     Palpations: Abdomen is soft.     Tenderness: There is no abdominal tenderness.  Musculoskeletal:        General: No tenderness.  Skin:    General: Skin is warm and dry.     Findings: No erythema or rash.  Neurological:     Mental Status: She is alert and oriented to person, place, and time.     ED Results and Treatments Labs (all labs ordered are listed, but only abnormal results are displayed) Labs Reviewed  HCG, QUANTITATIVE, PREGNANCY                                                                                                                         EKG  EKG Interpretation  Date/Time:  Wednesday November 23 2018 00:26:06 EST Ventricular Rate:   105 PR Interval:    QRS Duration: 82 QT Interval:  324 QTC Calculation: 429 R Axis:   68 Text Interpretation:  Sinus tachycardia Multiform ventricular premature complexes Right atrial enlargement Consider left ventricular hypertrophy DISREGARD Confirmed by Frederick Peers [phone]) on 11/23/2018 7:45:53 AM      Radiology Dg Chest 2 View  Result Date: 11/22/2018 CLINICAL DATA:  Chest pain EXAM: CHEST - 2 VIEW COMPARISON:  06/30/2017 FINDINGS: The heart size and mediastinal contours are within normal limits. Both lungs are clear. The visualized skeletal structures are unremarkable. IMPRESSION: No active cardiopulmonary disease. Electronically Signed   By: Tollie Eth M.D.   On: 11/22/2018 19:56   Pertinent labs & imaging results that were available during my care of the patient were reviewed by me and considered in my medical decision making (see chart for details).  Medications Ordered in ED Medications  acetaminophen (TYLENOL) tablet 650 mg (has no administration in time range)  acetaminophen (TYLENOL) tablet 1,000 mg (1,000 mg Oral Given 11/23/18 0734)  Procedures Procedures  (including critical care time)  Medical Decision Making / ED Course I have reviewed the nursing notes for this encounter and the patient's prior records (if available in EHR or on provided paperwork).    Atypical sternal chest pain inconsistent with ACS.  EKG without acute ischemic changes or evidence of pericarditis.  Troponin drawn last night was negative.  Do not feel that additional cardiac markers are necessary at this time.  Rest of the work-up last night was reassuring without leukocytosis or anemia.  Patient had mild hypokalemia without other significant electrolyte derangements or renal insufficiency.  Chest x-ray without evidence suggestive of pneumonia, pneumothorax,  pneumomediastinum.  No abnormal contour of the mediastinum to suggest dissection. No evidence of acute injuries.  Low suspicion for pulmonary embolism.  Presentation not classic for aortic dissection or esophageal perforation.  I-STAT beta-hCG was slightly elevated.  Given her vaginal bleeding, will repeat a formal hCG quant.  Currently patient has benign abdomen.   If hCG quant is positive, patient will likely need ultrasound.  Patient care turned over to Dr Clarene DukeLittle at [phone]. Patient case and results discussed in detail; please see their note for further ED managment.     This chart was dictated using voice recognition software.  Despite best efforts to proofread,  errors can occur which can change the documentation meaning.   Nira Connardama, Pedro Eduardo, MD 11/23/18 [phone]

## 2018-11-23 NOTE — Discharge Instructions (Addendum)
IT IS VERY IMPORTANT FOR [facility] OF YOUR PREGNANCY HORMONE.

## 2018-11-23 NOTE — ED Notes (Signed)
Pt states she had bloodwork drawn at [place], states she does not want blood redrawn.

## 2018-11-23 NOTE — Telephone Encounter (Signed)
Called patient to give her follow-up lab appt for hcg level check, No answer, LVM with appt date and time. Left office number to give a call back if needing to reschedule

## 2018-11-25 ENCOUNTER — Other Ambulatory Visit: Payer: Self-pay

## 2018-11-25 DIAGNOSIS — O039 Complete or unspecified spontaneous abortion without complication: Secondary | ICD-10-CM

## 2018-11-25 DIAGNOSIS — O3680X0 Pregnancy with inconclusive fetal viability, not applicable or unspecified: Secondary | ICD-10-CM

## 2018-11-25 LAB — HCG, QUANTITATIVE, PREGNANCY: hCG, Beta Chain, Quant, S: 8 m[IU]/mL — ABNORMAL HIGH (ref <5)

## 2018-11-25 NOTE — Progress Notes (Unsigned)
Patient presents to office today for non stat bhcg. Patient reports since ER visit being in a lot of pain at times, starting in her back wrapping around to LLQ. Patient reports pain is worse at night and the morning and has difficulty walking when this happens. Patient denies pain at this time as she just took a tylenol. Discussed with Dr Earlene Plater, will run stat bhcg today and patient will be notified of results. Discussed with patient if pain becomes severe again she should go to [facility] for evaluation. Patient verbalized understanding.

## 2018-11-28 ENCOUNTER — Inpatient Hospital Stay (HOSPITAL_COMMUNITY)
Admission: AD | Admit: 2018-11-28 | Discharge: 2018-11-29 | Disposition: A | Discharge status: Home / Self Care | Payer: Self-pay | Attending: Obstetrics and Gynecology | Admitting: Obstetrics and Gynecology

## 2018-11-28 ENCOUNTER — Encounter (HOSPITAL_COMMUNITY): Payer: Self-pay | Admitting: *Deleted

## 2018-11-28 DIAGNOSIS — O039 Complete or unspecified spontaneous abortion without complication: Secondary | ICD-10-CM | POA: Insufficient documentation

## 2018-11-28 DIAGNOSIS — O26891 Other specified pregnancy related conditions, first trimester: Secondary | ICD-10-CM

## 2018-11-28 DIAGNOSIS — K5900 Constipation, unspecified: Secondary | ICD-10-CM | POA: Insufficient documentation

## 2018-11-28 DIAGNOSIS — Z8759 Personal history of other complications of pregnancy, childbirth and the puerperium: Secondary | ICD-10-CM

## 2018-11-28 DIAGNOSIS — R109 Unspecified abdominal pain: Secondary | ICD-10-CM

## 2018-11-28 DIAGNOSIS — O3680X0 Pregnancy with inconclusive fetal viability, not applicable or unspecified: Secondary | ICD-10-CM

## 2018-11-28 DIAGNOSIS — A5901 Trichomonal vulvovaginitis: Secondary | ICD-10-CM | POA: Diagnosis present

## 2018-11-28 DIAGNOSIS — K649 Unspecified hemorrhoids: Secondary | ICD-10-CM | POA: Insufficient documentation

## 2018-11-28 DIAGNOSIS — F17210 Nicotine dependence, cigarettes, uncomplicated: Secondary | ICD-10-CM | POA: Insufficient documentation

## 2018-11-28 DIAGNOSIS — Z880 Allergy status to penicillin: Secondary | ICD-10-CM | POA: Insufficient documentation

## 2018-11-28 NOTE — MAU Note (Signed)
PT  SAYS FEELS PAIN LEFT SIDE /  AND  ABD- STARTED 2-18  AFTER HAD BEEN TO  [facility].   IS HAVING A SAB-  FEELS CONSTIPATED-  NO BM IN 1 MTH.   NO VOMITING- FEELS NAUSEA. Marland Kitchen   HAS INSERTED FINGER INTO RECTUM- FEELS STICKY WHITE  SUBSTANCE.   ALSO HAS HEMORR.    STILL HAS BROWN  SPOTTING-  WHEN SHE WIPES.

## 2018-11-29 ENCOUNTER — Inpatient Hospital Stay (HOSPITAL_COMMUNITY): Payer: Self-pay

## 2018-11-29 ENCOUNTER — Telehealth: Payer: Self-pay

## 2018-11-29 DIAGNOSIS — Z8759 Personal history of other complications of pregnancy, childbirth and the puerperium: Secondary | ICD-10-CM

## 2018-11-29 DIAGNOSIS — A5901 Trichomonal vulvovaginitis: Secondary | ICD-10-CM | POA: Diagnosis present

## 2018-11-29 DIAGNOSIS — K649 Unspecified hemorrhoids: Secondary | ICD-10-CM | POA: Diagnosis present

## 2018-11-29 LAB — URINALYSIS, ROUTINE W REFLEX MICROSCOPIC
Bilirubin Urine: NEGATIVE
Glucose, UA: NEGATIVE mg/dL
KETONES UR: NEGATIVE mg/dL
Nitrite: POSITIVE — AB
Protein, ur: NEGATIVE mg/dL
Specific Gravity, Urine: 1.01 (ref 1.005–1.030)
WBC, UA: >50 WBC/hpf — ABNORMAL HIGH (ref 0–5)
pH: 6 (ref 5.0–8.0)

## 2018-11-29 LAB — WET PREP, GENITAL
Sperm: NONE SEEN
Yeast Wet Prep HPF POC: NONE SEEN

## 2018-11-29 LAB — HCG, QUANTITATIVE, PREGNANCY: hCG, Beta Chain, Quant, S: 7 m[IU]/mL — ABNORMAL HIGH (ref <5)

## 2018-11-29 MED ORDER — POLYETHYLENE GLYCOL 3350 17 G PO PACK: 17.0000 g | PACK | Freq: Every day | ORAL | 0 refills | Status: DC | PRN

## 2018-11-29 MED ORDER — DOCUSATE SODIUM 100 MG PO CAPS: 100.0000 mg | ORAL_CAPSULE | Freq: Two times a day (BID) | ORAL | 0 refills | Status: DC

## 2018-11-29 MED ORDER — HYDROMORPHONE HCL 1 MG/ML IJ SOLN
1.0000 mg | Freq: Once | INTRAMUSCULAR | Status: AC
  Administered 2018-11-29: 1 mg via INTRAMUSCULAR
  Filled 2018-11-29: qty 1

## 2018-11-29 MED ORDER — TRAMADOL HCL 50 MG PO TABS: 50.0000 mg | ORAL_TABLET | Freq: Four times a day (QID) | ORAL | 0 refills | Status: DC | PRN

## 2018-11-29 MED ORDER — PROMETHAZINE HCL 25 MG/ML IJ SOLN
25.0000 mg | Freq: Once | INTRAMUSCULAR | Status: AC
  Administered 2018-11-29: 25 mg via INTRAMUSCULAR
  Filled 2018-11-29: qty 1

## 2018-11-29 MED ORDER — HYDROCORTISONE 2.5 % RE CREA: 1.0000 "application " | TOPICAL_CREAM | Freq: Two times a day (BID) | RECTAL | 0 refills | Status: DC

## 2018-11-29 NOTE — Telephone Encounter (Addendum)
Notified pt that she needed to come in one week for non- stat beta per Dr. Earlene Plater recommendation.  Pt states that she has an appt scheduled for 12/05/18 @ 1100.  Confirmed pt's appt.  Pt did not have any further questions.

## 2018-11-29 NOTE — [facility] Provider Note (Signed)
History     CSN: [id number]  Arrival date and time: 11/28/18 2259   First Provider Initiated Contact with Patient 11/29/18 0301      Chief Complaint  Patient presents with  . Abdominal Pain   HPI  Ms.  Rachel Horn is a 38 y.o. year old G12P4014 female at unknown weeks gestation who presents to [facility] reporting she is having a miscarriage with Lt side abdominal pain since 11/22/2018. She was seen at [facility] on 11/23/2018; found to have pregnancy of unknown location. Her HCG was 946 on 11/01/2018, 18 on 11/22/2018, 9 on 11/23/2018, and 8 on 11/25/2018.   History reviewed. No pertinent past medical history.  History reviewed. No pertinent surgical history.  History reviewed. No pertinent family history.  Social History   Tobacco Use  . Smoking status: Current Every Day Smoker    Packs/day: 1.00    Types: Cigarettes  . Smokeless tobacco: Never Used  Substance Use Topics  . Alcohol use: No  . Drug use: No    Allergies:  Allergies  Allergen Reactions  . Latex Swelling and Rash  . Penicillins Swelling and Rash    Did it involve swelling of the face/tongue/throat, SOB, or low BP? Unknown Did it involve sudden or severe rash/hives, skin peeling, or any reaction on the inside of your mouth or nose? Yes Did you need to seek medical attention at a hospital or doctor's office? Unknown When did it last happen?unk If all above answers are "NO", may proceed with cephalosporin use.     No medications prior to admission.    Review of Systems  Constitutional: Negative.   HENT: Negative.   Eyes: Negative.   Respiratory: Negative.   Cardiovascular: Negative.   Gastrointestinal: Positive for abdominal pain, constipation ("no BM in 1 month"; white sticky stuff in rectum when sticks finger up there; (+) hemorrhoids) and nausea. Negative for vomiting ("unable to vomit d/t abd pain").  Endocrine: Negative.   Genitourinary: Positive for pelvic pain (LLQ sharp pain).  Musculoskeletal:  Negative.   Skin: Negative.   Allergic/Immunologic: Negative.   Neurological: Negative.   Hematological: Negative.   Psychiatric/Behavioral: Negative.    Physical Exam   Blood pressure 112/63, pulse 81, temperature 98.4 F (36.9 C), temperature source Oral, resp. rate 18, height 5\' 4"  (1.626 m), weight 52.4 kg, last menstrual period 11/23/2018.  Physical Exam  Nursing note and vitals reviewed. Constitutional: She is oriented to person, place, and time. She appears well-developed and well-nourished.  HENT:  Head: Normocephalic and atraumatic.  Eyes: Pupils are equal, round, and reactive to light.  Neck: Normal range of motion.  Cardiovascular: Normal rate and regular rhythm.  Respiratory: Effort normal.  GI: Soft.  Multiple external, non-thrombosed hemorrhoids  Genitourinary:    Genitourinary Comments: Uterus: tender, SE: cervix is smooth, pink, no lesions, scant amt of thin, white vaginal d/c -- WP, GC/CT done, closed/long/firm, no CMT or friability, significant LT adnexal tenderness    Musculoskeletal: Normal range of motion.  Neurological: She is alert and oriented to person, place, and time. She has normal reflexes.  Skin: Skin is warm and dry.  Psychiatric: She has a normal mood and affect. Her behavior is normal. Judgment and thought content normal.    MAU Course  Procedures  MDM [facility] HCG Wet Prep GC/CT -- pending  Results for orders placed or performed during the hospital encounter of 11/28/18 (from the past 24 hour(s))  hCG, quantitative, pregnancy     Status: Abnormal  Collection Time: 11/28/18 11:18 PM  Result Value Ref Range   hCG, Beta Chain, Quant, S 7 (H) <5 mIU/mL  Wet prep, genital     Status: Abnormal   Collection Time: 11/29/18  3:24 AM  Result Value Ref Range   Yeast Wet Prep HPF POC NONE SEEN NONE SEEN   Trich, Wet Prep PRESENT (A) NONE SEEN   Clue Cells Wet Prep HPF POC PRESENT (A) NONE SEEN   WBC, Wet Prep HPF POC MANY (A) NONE SEEN   Sperm  NONE SEEN   Urinalysis, Routine w reflex microscopic     Status: Abnormal   Collection Time: 11/29/18  3:32 AM  Result Value Ref Range   Color, Urine YELLOW YELLOW   APPearance CLOUDY (A) CLEAR   Specific Gravity, Urine 1.010 1.005 - 1.030   pH 6.0 5.0 - 8.0   Glucose, UA NEGATIVE NEGATIVE mg/dL   Hgb urine dipstick MODERATE (A) NEGATIVE   Bilirubin Urine NEGATIVE NEGATIVE   Ketones, ur NEGATIVE NEGATIVE mg/dL   Protein, ur NEGATIVE NEGATIVE mg/dL   Nitrite POSITIVE (A) NEGATIVE   Leukocytes,Ua LARGE (A) NEGATIVE   RBC / HPF 0-5 0 - 5 RBC/hpf   WBC, UA >50 (H) 0 - 5 WBC/hpf   Bacteria, UA FEW (A) NONE SEEN   Squamous Epithelial / LPF 0-5 0 - 5   WBC Clumps PRESENT    Mucus PRESENT      Assessment and Plan  Miscarriage within last 12 months  - Information provided on miscarriage  - F/U with [facility] on Monday 12/05/2018 @ 1100  Pregnancy of unknown anatomic location   Constipation, unspecified constipation type  - Rxs for Miralax & Colace - Information provided on constipation   Hemorrhoids - Rx for Anusol HC cream for hemorrhoids  Trichomonas - Rx for Flagyl 2000 mg sent to pharmacy d/t [facility] results delayed -- tried to contact pt after she was d/c'd, but unable to get through using preferred number in chart - MyChart message with information sent to patient  - Discharge patient - Patient verbalized an understanding of the plan of care and agrees.    Rachel Mora, MSN, CNM 11/29/2018, 3:01 AM

## 2018-11-29 NOTE — Telephone Encounter (Signed)
-----   Message from Osvaldo Human, RN sent at 11/29/2018  1:34 PM EST ----- Dr. Earlene Plater, It looks like she had an hcg done yesterday at [facility].  How would you like for [place] to proceed with her follow up?  Morrie Sheldon RN ----- Message ----- From: Conan Bowens, MD Sent: 11/25/2018   2:15 PM EST To: [facility] Clinical Pool  Needs non-stat repeat HCG in one week

## 2018-12-02 LAB — GC/CHLAMYDIA PROBE AMP (~~LOC~~) NOT AT ARMC
Chlamydia: NEGATIVE
Neisseria Gonorrhea: NEGATIVE

## 2018-12-05 ENCOUNTER — Other Ambulatory Visit: Payer: Medicaid Other

## 2018-12-05 ENCOUNTER — Other Ambulatory Visit: Payer: Self-pay | Admitting: *Deleted

## 2018-12-05 DIAGNOSIS — O039 Complete or unspecified spontaneous abortion without complication: Secondary | ICD-10-CM

## 2018-12-06 ENCOUNTER — Other Ambulatory Visit: Payer: Medicaid Other

## 2019-02-11 ENCOUNTER — Telehealth: Payer: Medicaid Other | Admitting: Physician Assistant

## 2019-02-11 DIAGNOSIS — R300 Dysuria: Secondary | ICD-10-CM

## 2019-02-11 DIAGNOSIS — R109 Unspecified abdominal pain: Secondary | ICD-10-CM

## 2019-02-11 DIAGNOSIS — M549 Dorsalgia, unspecified: Secondary | ICD-10-CM

## 2019-02-11 DIAGNOSIS — N898 Other specified noninflammatory disorders of vagina: Secondary | ICD-10-CM

## 2019-02-11 NOTE — Progress Notes (Signed)
Based on what you shared with me, I feel your condition warrants further evaluation and I recommend that you be seen for a face to face office visit.     NOTE: If you entered your credit card information for this eVisit, you will not be charged. You may see a "hold" on your card for the $35 but that hold will drop off and you will not have a charge processed.  If you are having a true medical emergency please call 911.  If you need an urgent face to face visit, [place] has four urgent care centers for your convenience.    PLEASE NOTE: THE [facility] LOCATIONS AND URGENT CARE CLINICS DO NOT HAVE THE TESTING FOR CORONAVIRUS COVID19 AVAILABLE.  IF YOU FEEL YOU NEED THIS TEST YOU MUST GO TO A TRIAGE LOCATION AT ONE OF THE HOSPITAL EMERGENCY DEPARTMENTS ?  [email or web address] to reserve your spot online an avoid wait times  [facility] [address], [address] [place], [place] [address] Modified hours of operation: Monday-Friday, 12 PM to 6 PM  Saturday & Sunday 10 AM to 4 PM *Across the street from [employer]  [facility] (New Address!) [address], [address] [place], [place] [address] *Just off [address], across the road from [employer]* Modified hours of operation: Monday-Friday, 12 PM to 6 PM  Closed Saturday & Sunday  [facility]'s modified hours of operation will be in effect from May 1 until May 31   The following sites will take your insurance:  [facility]  [phone] Get Driving Directions Find a Provider at this Location  [address] [place], [place] [address] 10 am to 8 pm Monday-Friday 12 pm to 8 pm [facility]   [facility]  [phone] Get Driving Directions Find a Provider at this Location  [address], [address] [place], [place] [address] 8 am to 8 pm Monday-Friday 9 am to 6 pm Saturday 11 am to 6 pm Sunday   [facility]  at [facility] [place]  [phone] Get Driving Directions  [address] [place], [place] [address] 8 am to 8 pm Monday-Friday 8 am to 4 pm Saturday-Sunday   Your e-visit answers were reviewed by a board certified advanced clinical practitioner to complete your personal care plan.  Thank you for using e-Visits.  ===View-only below this line===   ----- Message -----    From: Rachel SetonShameka L Horn    Sent: 02/11/2019  6:51 PM EDT      To: E-Visit Mailing List Subject: E-Visit Submission: Urinary Problems  E-Visit Submission: Urinary Problems --------------------------------  Question: Which of the following are you experiencing? Answer:   Change in urine appearance or smell  Question: Are you able to pass urine? Answer:   Yes, I can pass urine.  Question: How long have you had pain or difficulty passing urine? Answer:   More  than two days but less than one week  Question: Do you have a fever? Answer:   No, I do not have a fever  Question: Do you have any of the following? Answer:   I have back pain with this illness            I have belly pain with this illness            I have been vomiting  Question: Do you have an exaggerated sensation of the need to pass urine? Answer:   Yes, the sensation  is exaggerated  Question: Do you have the urge to urinate more of less frequently than normal? Answer:   More frequently  Question: What does your urine look like? Answer:   It is cloudy  Question: Do you have any of the following? Answer:   An unusual smell  Question: Do you have any of the following? Answer:   A white discharge  Question: Do you have any sores on your genitals? Answer:   No  Question: Do you have any history of kidney dysfunction or kidney problems? Answer:   No  Question: Within the past 3 months, have you had any surgery on your kidneys or bladder, or have you had a tube inserted to collect your urine? Answer:   No, I have never had  either  Question: Have you had similar symptoms in the past? Answer:   Yes, I have had similar symptoms more than once before  Question: If you had similar symptoms in the past, did any of the following work? Answer:   I do not remember  Question: Please list any additional comments  Answer:   I may have a kidney infection  Question: Please list your medication allergies that you may have ? (If 'none' , please list as 'none') Answer:   Penicillin  Question: Are you pregnant? Answer:   I am confident that I am not pregnant  Question: Are you breastfeeding? Answer:   No

## 2019-02-11 NOTE — Progress Notes (Signed)
Patient writing in with a different but similar complaint after being advised to seek face to face care. Deliah Boston, MS, PA-C 7:03 PM, 02/11/2019

## 2020-03-15 ENCOUNTER — Inpatient Hospital Stay: Admission: RE | Admit: 2020-03-15 | Payer: Medicaid Other | Source: Ambulatory Visit

## 2020-03-16 ENCOUNTER — Encounter (HOSPITAL_COMMUNITY): Payer: Self-pay

## 2020-03-16 ENCOUNTER — Other Ambulatory Visit: Payer: Self-pay

## 2020-03-16 ENCOUNTER — Emergency Department (HOSPITAL_COMMUNITY)
Admission: EM | Admit: 2020-03-16 | Discharge: 2020-03-16 | Discharge status: Against Medical Advice | Payer: Medicaid Other | Source: Home / Self Care

## 2020-03-16 LAB — COMPREHENSIVE METABOLIC PANEL
ALT: 20 U/L (ref 0–44)
AST: 23 U/L (ref 15–41)
Albumin: 3.4 g/dL — ABNORMAL LOW (ref 3.5–5.0)
Alkaline Phosphatase: 80 U/L (ref 38–126)
Anion gap: 10 (ref 5–15)
BUN: 5 mg/dL — ABNORMAL LOW (ref 6–20)
CO2: 25 mmol/L (ref 22–32)
Calcium: 8.9 mg/dL (ref 8.9–10.3)
Chloride: 101 mmol/L (ref 98–111)
Creatinine, Ser: 0.79 mg/dL (ref 0.44–1.00)
GFR calc Af Amer: >60 mL/min (ref >60)
GFR calc non Af Amer: >60 mL/min (ref >60)
Glucose, Bld: 111 mg/dL — ABNORMAL HIGH (ref 70–99)
Potassium: 3.2 mmol/L — ABNORMAL LOW (ref 3.5–5.1)
Sodium: 136 mmol/L (ref 135–145)
Total Bilirubin: 0.7 mg/dL (ref 0.3–1.2)
Total Protein: 7.1 g/dL (ref 6.5–8.1)

## 2020-03-16 LAB — I-STAT BETA HCG BLOOD, ED (MC, WL, AP ONLY): I-stat hCG, quantitative: 8.6 m[IU]/mL — ABNORMAL HIGH (ref <5)

## 2020-03-16 LAB — CBC
HCT: 41.7 % (ref 36.0–46.0)
Hemoglobin: 13.3 g/dL (ref 12.0–15.0)
MCH: 28.3 pg (ref 26.0–34.0)
MCHC: 31.9 g/dL (ref 30.0–36.0)
MCV: 88.7 fL (ref 80.0–100.0)
Platelets: 287 10*3/uL (ref 150–400)
RBC: 4.7 MIL/uL (ref 3.87–5.11)
RDW: 15 % (ref 11.5–15.5)
WBC: 13.5 10*3/uL — ABNORMAL HIGH (ref 4.0–10.5)
nRBC: 0 % (ref 0.0–0.2)

## 2020-03-16 LAB — LIPASE, BLOOD: Lipase: 21 U/L (ref 11–51)

## 2020-03-16 MED ORDER — SODIUM CHLORIDE 0.9% FLUSH: 3.0000 mL | Freq: Once | INTRAVENOUS | Status: DC

## 2020-03-16 NOTE — ED Notes (Signed)
PT called x 3 no answer

## 2020-03-16 NOTE — ED Notes (Signed)
Called for pt 3x, no answer.

## 2020-03-16 NOTE — ED Triage Notes (Signed)
Pt arrives to ED w/ c/o flank pain radiating toward LLQ abdomen. Pt denies urinary symptoms. Pt states she was diagnoses w/ uterine fibroids earlier this year. Pt reports 10/10 pain.

## 2020-03-17 ENCOUNTER — Emergency Department (HOSPITAL_COMMUNITY): Payer: Medicaid Other

## 2020-03-17 ENCOUNTER — Inpatient Hospital Stay (HOSPITAL_COMMUNITY)
Admission: EM | Admit: 2020-03-17 | Discharge: 2020-03-19 | DRG: 872 | Disposition: A | Discharge status: Home / Self Care | Payer: Medicaid Other | Attending: Family Medicine | Admitting: Family Medicine

## 2020-03-17 ENCOUNTER — Encounter (HOSPITAL_COMMUNITY): Payer: Self-pay | Admitting: Emergency Medicine

## 2020-03-17 DIAGNOSIS — E876 Hypokalemia: Secondary | ICD-10-CM | POA: Diagnosis present

## 2020-03-17 DIAGNOSIS — Z720 Tobacco use: Secondary | ICD-10-CM | POA: Diagnosis not present

## 2020-03-17 DIAGNOSIS — A419 Sepsis, unspecified organism: Principal | ICD-10-CM | POA: Diagnosis present

## 2020-03-17 DIAGNOSIS — F17210 Nicotine dependence, cigarettes, uncomplicated: Secondary | ICD-10-CM | POA: Diagnosis present

## 2020-03-17 DIAGNOSIS — Z880 Allergy status to penicillin: Secondary | ICD-10-CM | POA: Diagnosis not present

## 2020-03-17 DIAGNOSIS — N10 Acute tubulo-interstitial nephritis: Secondary | ICD-10-CM | POA: Diagnosis present

## 2020-03-17 DIAGNOSIS — Z91040 Latex allergy status: Secondary | ICD-10-CM | POA: Diagnosis not present

## 2020-03-17 DIAGNOSIS — N12 Tubulo-interstitial nephritis, not specified as acute or chronic: Secondary | ICD-10-CM | POA: Diagnosis not present

## 2020-03-17 DIAGNOSIS — Z79899 Other long term (current) drug therapy: Secondary | ICD-10-CM | POA: Diagnosis not present

## 2020-03-17 DIAGNOSIS — Z20822 Contact with and (suspected) exposure to covid-19: Secondary | ICD-10-CM | POA: Diagnosis present

## 2020-03-17 DIAGNOSIS — N390 Urinary tract infection, site not specified: Secondary | ICD-10-CM | POA: Diagnosis not present

## 2020-03-17 LAB — PROTIME-INR
INR: 1.2 (ref 0.8–1.2)
Prothrombin Time: 14.8 seconds (ref 11.4–15.2)

## 2020-03-17 LAB — CBC WITH DIFFERENTIAL/PLATELET
Abs Immature Granulocytes: 0.12 10*3/uL — ABNORMAL HIGH (ref 0.00–0.07)
Basophils Absolute: 0 10*3/uL (ref 0.0–0.1)
Basophils Relative: 0 %
Eosinophils Absolute: 0 10*3/uL (ref 0.0–0.5)
Eosinophils Relative: 0 %
HCT: 38.5 % (ref 36.0–46.0)
Hemoglobin: 12.9 g/dL (ref 12.0–15.0)
Immature Granulocytes: 1 %
Lymphocytes Relative: 5 %
Lymphs Abs: 0.8 10*3/uL (ref 0.7–4.0)
MCH: 28.8 pg (ref 26.0–34.0)
MCHC: 33.5 g/dL (ref 30.0–36.0)
MCV: 85.9 fL (ref 80.0–100.0)
Monocytes Absolute: 1 10*3/uL (ref 0.1–1.0)
Monocytes Relative: 6 %
Neutro Abs: 14.9 10*3/uL — ABNORMAL HIGH (ref 1.7–7.7)
Neutrophils Relative %: 88 %
Platelets: 293 10*3/uL (ref 150–400)
RBC: 4.48 MIL/uL (ref 3.87–5.11)
RDW: 14.9 % (ref 11.5–15.5)
WBC: 16.9 10*3/uL — ABNORMAL HIGH (ref 4.0–10.5)
nRBC: 0 % (ref 0.0–0.2)

## 2020-03-17 LAB — COMPREHENSIVE METABOLIC PANEL
ALT: 34 U/L (ref 0–44)
AST: 43 U/L — ABNORMAL HIGH (ref 15–41)
Albumin: 3.3 g/dL — ABNORMAL LOW (ref 3.5–5.0)
Alkaline Phosphatase: 97 U/L (ref 38–126)
Anion gap: 14 (ref 5–15)
BUN: 7 mg/dL (ref 6–20)
CO2: 24 mmol/L (ref 22–32)
Calcium: 8.7 mg/dL — ABNORMAL LOW (ref 8.9–10.3)
Chloride: 103 mmol/L (ref 98–111)
Creatinine, Ser: 0.71 mg/dL (ref 0.44–1.00)
GFR calc Af Amer: >60 mL/min (ref >60)
GFR calc non Af Amer: >60 mL/min (ref >60)
Glucose, Bld: 146 mg/dL — ABNORMAL HIGH (ref 70–99)
Potassium: 3.3 mmol/L — ABNORMAL LOW (ref 3.5–5.1)
Sodium: 141 mmol/L (ref 135–145)
Total Bilirubin: 0.7 mg/dL (ref 0.3–1.2)
Total Protein: 7.1 g/dL (ref 6.5–8.1)

## 2020-03-17 LAB — URINALYSIS, ROUTINE W REFLEX MICROSCOPIC
Bacteria, UA: NONE SEEN
Bilirubin Urine: NEGATIVE
Glucose, UA: NEGATIVE mg/dL
Ketones, ur: 20 mg/dL — AB
Nitrite: NEGATIVE
Protein, ur: 100 mg/dL — AB
Specific Gravity, Urine: 1.018 (ref 1.005–1.030)
WBC, UA: >50 WBC/hpf — ABNORMAL HIGH (ref 0–5)
pH: 5 (ref 5.0–8.0)

## 2020-03-17 LAB — APTT: aPTT: 41 seconds — ABNORMAL HIGH (ref 24–36)

## 2020-03-17 LAB — SARS CORONAVIRUS 2 BY RT PCR (HOSPITAL ORDER, PERFORMED IN ~~LOC~~ HOSPITAL LAB): SARS Coronavirus 2: NEGATIVE

## 2020-03-17 LAB — PREGNANCY, URINE: Preg Test, Ur: NEGATIVE

## 2020-03-17 LAB — LACTIC ACID, PLASMA
Lactic Acid, Venous: 2.8 mmol/L (ref 0.5–1.9)
Lactic Acid, Venous: 0.9 mmol/L (ref 0.5–1.9)

## 2020-03-17 MED ORDER — ACETAMINOPHEN 325 MG PO TABS
650.0000 mg | ORAL_TABLET | Freq: Four times a day (QID) | ORAL | Status: DC | PRN
  Administered 2020-03-18 – 2020-03-19 (×2): 650 mg via ORAL
  Filled 2020-03-17 (×2): qty 2

## 2020-03-17 MED ORDER — OXYCODONE HCL 5 MG PO TABS
5.0000 mg | ORAL_TABLET | ORAL | Status: DC | PRN
  Administered 2020-03-18 (×2): 5 mg via ORAL
  Filled 2020-03-17 (×2): qty 1

## 2020-03-17 MED ORDER — LACTATED RINGERS IV BOLUS (SEPSIS)
1000.0000 mL | Freq: Once | INTRAVENOUS | Status: AC
  Administered 2020-03-17: 1000 mL via INTRAVENOUS

## 2020-03-17 MED ORDER — LEVOFLOXACIN IN D5W 750 MG/150ML IV SOLN: 750.0000 mg | Freq: Once | INTRAVENOUS | Status: DC

## 2020-03-17 MED ORDER — IOHEXOL 300 MG/ML  SOLN
100.0000 mL | Freq: Once | INTRAMUSCULAR | Status: AC | PRN
  Administered 2020-03-17: 100 mL via INTRAVENOUS

## 2020-03-17 MED ORDER — NICOTINE 14 MG/24HR TD PT24
14.0000 mg | MEDICATED_PATCH | Freq: Every day | TRANSDERMAL | Status: DC
  Administered 2020-03-17 – 2020-03-19 (×3): 14 mg via TRANSDERMAL
  Filled 2020-03-17 (×3): qty 1

## 2020-03-17 MED ORDER — ALBUTEROL SULFATE (2.5 MG/3ML) 0.083% IN NEBU: 2.5000 mg | INHALATION_SOLUTION | Freq: Four times a day (QID) | RESPIRATORY_TRACT | Status: DC | PRN

## 2020-03-17 MED ORDER — MAGNESIUM HYDROXIDE 400 MG/5ML PO SUSP: 30.0000 mL | Freq: Every day | ORAL | Status: DC | PRN

## 2020-03-17 MED ORDER — KETOROLAC TROMETHAMINE 15 MG/ML IJ SOLN: 15.0000 mg | Freq: Four times a day (QID) | INTRAMUSCULAR | Status: DC | PRN

## 2020-03-17 MED ORDER — IBUPROFEN 200 MG PO TABS
600.0000 mg | ORAL_TABLET | Freq: Once | ORAL | Status: AC
  Administered 2020-03-17: 600 mg via ORAL
  Filled 2020-03-17: qty 3

## 2020-03-17 MED ORDER — SODIUM CHLORIDE (PF) 0.9 % IJ SOLN
INTRAMUSCULAR | Status: AC
  Filled 2020-03-17: qty 50

## 2020-03-17 MED ORDER — LACTATED RINGERS IV BOLUS (SEPSIS)
250.0000 mL | Freq: Once | INTRAVENOUS | Status: AC
  Administered 2020-03-17: 250 mL via INTRAVENOUS

## 2020-03-17 MED ORDER — ENOXAPARIN SODIUM 40 MG/0.4ML ~~LOC~~ SOLN
40.0000 mg | SUBCUTANEOUS | Status: DC
  Administered 2020-03-17 – 2020-03-18 (×2): 40 mg via SUBCUTANEOUS
  Filled 2020-03-17 (×2): qty 0.4

## 2020-03-17 MED ORDER — ONDANSETRON HCL 4 MG/2ML IJ SOLN
4.0000 mg | Freq: Four times a day (QID) | INTRAMUSCULAR | Status: DC | PRN
  Administered 2020-03-18: 4 mg via INTRAVENOUS
  Filled 2020-03-17: qty 2

## 2020-03-17 MED ORDER — TRAMADOL HCL 50 MG PO TABS
50.0000 mg | ORAL_TABLET | Freq: Four times a day (QID) | ORAL | Status: DC | PRN
  Administered 2020-03-18 – 2020-03-19 (×2): 50 mg via ORAL
  Filled 2020-03-17 (×2): qty 1

## 2020-03-17 MED ORDER — TRAZODONE HCL 50 MG PO TABS: 25.0000 mg | ORAL_TABLET | Freq: Every evening | ORAL | Status: DC | PRN

## 2020-03-17 MED ORDER — ONDANSETRON HCL 4 MG PO TABS: 4.0000 mg | ORAL_TABLET | Freq: Four times a day (QID) | ORAL | Status: DC | PRN

## 2020-03-17 MED ORDER — POTASSIUM CHLORIDE IN NACL 20-0.9 MEQ/L-% IV SOLN
INTRAVENOUS | Status: DC
  Filled 2020-03-17 (×4): qty 1000

## 2020-03-17 MED ORDER — SODIUM CHLORIDE 0.9 % IV SOLN
2.0000 g | Freq: Once | INTRAVENOUS | Status: AC
  Administered 2020-03-17: 2 g via INTRAVENOUS
  Filled 2020-03-17: qty 20

## 2020-03-17 MED ORDER — ACETAMINOPHEN 650 MG RE SUPP: 650.0000 mg | Freq: Four times a day (QID) | RECTAL | Status: DC | PRN

## 2020-03-17 MED ORDER — METRONIDAZOLE IN NACL 5-0.79 MG/ML-% IV SOLN
500.0000 mg | Freq: Once | INTRAVENOUS | Status: AC
  Administered 2020-03-17: 500 mg via INTRAVENOUS
  Filled 2020-03-17: qty 100

## 2020-03-17 MED ORDER — LACTATED RINGERS IV BOLUS (SEPSIS)
500.0000 mL | Freq: Once | INTRAVENOUS | Status: AC
  Administered 2020-03-17: 500 mL via INTRAVENOUS

## 2020-03-17 MED ORDER — SODIUM CHLORIDE 0.9 % IV SOLN
1.0000 g | INTRAVENOUS | Status: DC
  Administered 2020-03-18: 1 g via INTRAVENOUS
  Filled 2020-03-17: qty 10
  Filled 2020-03-17: qty 1

## 2020-03-17 MED ORDER — ALBUTEROL SULFATE HFA 108 (90 BASE) MCG/ACT IN AERS: 2.0000 | INHALATION_SPRAY | Freq: Four times a day (QID) | RESPIRATORY_TRACT | Status: DC | PRN

## 2020-03-17 NOTE — ED Notes (Signed)
Date and time results received: 03/17/20 5:24 PM  Test: 2.8 Critical Value: Lactic acid  Name of Provider Notified: [email or web address]

## 2020-03-17 NOTE — H&P (Addendum)
[facility] [place] NAME: Rachel Horn    MR#:  [id number]  DATE OF BIRTH:  [birth year]  DATE OF ADMISSION:  03/17/2020  PRIMARY CARE PHYSICIAN: Patient, No Pcp Per   REQUESTING/REFERRING PHYSICIAN: Emeterio Reeve, PA-C  CHIEF COMPLAINT:   Chief Complaint  Patient presents with  . Abdominal Pain  Fever  HISTORY OF PRESENT ILLNESS:  Rachel Horn  is a 39 y.o. female with a known history of ongoing tobacco abuse, presented to the emergency room with acute onset of fever and chills today with back pain as well as left flank pain for the last 10 days which have been worsening over the last 5 days with associated urinary frequency and urgency without dysuria or hematuria.  She admitted to nausea and vomiting once.  She has been having left upper quadrant abdominal pain and tenderness.  She denies any cough or shortness of breath or wheezing.  No rhinorrhea or nasal congestion or sore throat or earache.  Upon presentation to the emergency room, temperature was initially 102.6 with heart rate of 102 that was up to 140 then down to 108 and briefly blood pressure was 92/67 and later was up to 108/67 with hydration.  Labs revealed hypokalemia 3.3 and a serum lipase of 43 with otherwise unremarkable CMP.  Lactic acid was 2.8 and later 0.9 and CBC showed leukocytosis 16.9 compared to 13.5 yesterday.  Urinalysis was positive for UTI.  Blood and urine culture was sent.  Abdominal pelvic CT scan showed left pyelonephritis with no renal abscess or calculi.  The patient was initially given IV ceftriaxone and Flagyl as well as 600 mg p.o. ibuprofen and 1 L bolus of IV lactated Ringer at the other 750 mL.  She will be admitted to a medical bed for further evaluation and management. PAST MEDICAL HISTORY:  Ongoing tobacco abuse.  PAST SURGICAL HISTORY:  History reviewed. No pertinent surgical history.  She denies any previous surgeries.  SOCIAL HISTORY:   Social History   Tobacco  Use  . Smoking status: Current Every Day Smoker    Packs/day: 1.00    Types: Cigarettes  . Smokeless tobacco: Never Used  Substance Use Topics  . Alcohol use: No    FAMILY HISTORY:  No family history on file.  DRUG ALLERGIES:   Allergies  Allergen Reactions  . Latex Swelling and Rash  . Penicillins Swelling and Rash    Did it involve swelling of the face/tongue/throat, SOB, or low BP? Unknown Did it involve sudden or severe rash/hives, skin peeling, or any reaction on the inside of your mouth or nose? Yes Did you need to seek medical attention at a hospital or doctor's office? Unknown When did it last happen?unk If all above answers are "NO", may proceed with cephalosporin use.     REVIEW OF SYSTEMS:   ROS As per history of present illness. All pertinent systems were reviewed above. Constitutional,  HEENT, cardiovascular, respiratory, GI, GU, musculoskeletal, neuro, psychiatric, endocrine,  integumentary and hematologic systems were reviewed and are otherwise  negative/unremarkable except for positive findings mentioned above in the HPI.   MEDICATIONS AT HOME:   Prior to Admission medications   Medication Sig Start Date End Date Taking? Authorizing Provider  acetaminophen (TYLENOL) 500 MG tablet Take 500 mg by mouth every 6 (six) hours as needed for moderate pain.   Yes [provider]  albuterol (VENTOLIN HFA) 108 (90 Base) MCG/ACT inhaler Inhale 2 puffs into the lungs every 6 (  six) hours as needed for wheezing or shortness of breath.  01/02/20  Yes [provider]  traMADol (ULTRAM) 50 MG tablet Take 1 tablet (50 mg total) by mouth every 6 (six) hours as needed. Patient taking differently: Take 50 mg by mouth every 6 (six) hours as needed for moderate pain (migraines).  11/29/18  Yes Raelyn Mora, CNM  docusate sodium (COLACE) 100 MG capsule Take 1 capsule (100 mg total) by mouth 2 (two) times daily. Patient not taking: Reported on 03/17/2020  11/29/18   Raelyn Mora, CNM  hydrocortisone (ANUSOL-HC) 2.5 % rectal cream Place 1 application rectally 2 (two) times daily. Patient not taking: Reported on 03/17/2020 11/29/18   Raelyn Mora, CNM  polyethylene glycol [facility] Ethelene Hal) packet Take 17 g by mouth daily as needed for moderate constipation. Patient not taking: Reported on 03/17/2020 11/29/18   Raelyn Mora, CNM      VITAL SIGNS:  Blood pressure 102/71, pulse 96, temperature 98.1 F (36.7 C), temperature source Oral, resp. rate 19, SpO2 97 %.  PHYSICAL EXAMINATION:  Physical Exam  GENERAL:  39 y.o.-year-old African-American female patient lying in the bed with no acute distress.  EYES: Pupils equal, round, reactive to light and accommodation. No scleral icterus. Extraocular muscles intact.  HEENT: Head atraumatic, normocephalic. Oropharynx and nasopharynx clear.  NECK:  Supple, no jugular venous distention. No thyroid enlargement, no tenderness.  LUNGS: Normal breath sounds bilaterally, no wheezing, rales,rhonchi or crepitation. No use of accessory muscles of respiration.  CARDIOVASCULAR: Regular rate and rhythm, S1, S2 normal. No murmurs, rubs, or gallops.  ABDOMEN: Soft, nondistended. Bowel sounds present. No organomegaly or mass.  She had left upper quadrant tenderness without rebound tenderness guarding or rigidity.  She had left CVA tenderness. EXTREMITIES: No pedal edema, cyanosis, or clubbing.  NEUROLOGIC: Cranial nerves II through XII are intact. Muscle strength 5/5 in all extremities. Sensation intact. Gait not checked.  PSYCHIATRIC: The patient is alert and oriented x 3.  Normal affect and good eye contact. SKIN: No obvious rash, lesion, or ulcer.   LABORATORY PANEL:   CBC Recent Labs  Lab 03/17/20 1618  WBC 16.9*  HGB 12.9  HCT 38.5  PLT 293   ------------------------------------------------------------------------------------------------------------------  Chemistries  Recent Labs  Lab  03/17/20 1618  NA 141  K 3.3*  CL 103  CO2 24  GLUCOSE 146*  BUN 7  CREATININE 0.71  CALCIUM 8.7*  AST 43*  ALT 34  ALKPHOS 97  BILITOT 0.7   ------------------------------------------------------------------------------------------------------------------  Cardiac Enzymes No results for input(s): TROPONINI in the last 168 hours. ------------------------------------------------------------------------------------------------------------------  RADIOLOGY:  CT ABDOMEN PELVIS W CONTRAST  Result Date: 03/17/2020 CLINICAL DATA:  Abdominal pain for 5 days.  Fever. EXAM: CT ABDOMEN AND PELVIS WITH CONTRAST TECHNIQUE: Multidetector CT imaging of the abdomen and pelvis was performed using the standard protocol following bolus administration of intravenous contrast. CONTRAST:  OMNIPAQUE IOHEXOL 300 MG/ML  SOLN COMPARISON:  None. FINDINGS: Lower chest: Lung bases are clear. Hepatobiliary: No focal liver abnormality is seen. No gallstones, gallbladder wall thickening, or biliary dilatation. Pancreas: No ductal dilatation or inflammation. Spleen: Normal in size without focal abnormality. Adrenals/Urinary Tract: Normal adrenal glands. There is heterogeneous enhancement of the left kidney with mild left ureteral enhancement and thickening. Mild left perinephric edema. No renal or ureteral calculi. No evidence of focal fluid collection. 1 Urinary bladder is minimally distended and not well assessed. Homogeneous enhancement of the right kidney. Subcentimeter low-density in the periphery of the right kidney is  likely cyst. Stomach/Bowel: Bowel evaluation is limited in the absence of enteric contrast and paucity of intra-abdominal fat. Stomach is nondistended. There is no evidence of small-bowel obstruction. No obvious small bowel inflammation. Normal appendix, for example series 5, image 59. Cecum is located in the deep mid pelvis. Moderate volume of stool in the ascending and transverse colon. There is  transverse and sigmoid colonic redundancy. No colonic wall thickening or inflammation. Vascular/Lymphatic: Normal caliber abdominal aorta. The portal vein is patent. There are few prominent retroperitoneal nodes are likely reactive. Reproductive: The uterus is anteverted. Small physiologic cysts in the left ovary. Right ovary is not definitively visualized. Cystic structures appear to represent fluid within small bowel rather than adnexal lesion. Other: Small amount of free fluid in the dependent pelvis. No free air. No focal fluid collection. Musculoskeletal: There are no acute or suspicious osseous abnormalities. IMPRESSION: Left pyelonephritis.  No renal abscess or calculi. Electronically Signed   By: Narda Rutherford M.D.   On: 03/17/2020 18:19   DG Chest Port 1 View  Result Date: 03/17/2020 CLINICAL DATA:  Sepsis, back pain, current smoker EXAM: PORTABLE CHEST 1 VIEW COMPARISON:  11/22/2018 chest radiograph. FINDINGS: Stable cardiomediastinal silhouette with normal heart size. No pneumothorax. No pleural effusion. Lungs appear clear, with no acute consolidative airspace disease and no pulmonary edema. IMPRESSION: No active disease. Electronically Signed   By: Delbert Phenix M.D.   On: 03/17/2020 17:12      IMPRESSION AND PLAN:   1.  Acute left pyelonephritis. -The patient will be admitted to a medical bed.  Heart rate is currently down to 88 with respiratory rate of 11-14 and her temperature is down to 98.1. -Continue antibiotic therapy with IV Rocephin. -She will be continued on hydration with IV normal saline. -We will follow lactic acid level, urine and blood cultures.  2.  Sepsis secondary to left pyelonephritis. -Management as above.  3.  Ongoing tobacco abuse. -She was counseled for smoking cessation and will receive further counseling here.  4.  Hypokalemia. -Potassium will be replaced and magnesium level will be checked.  5.  DVT prophylaxis. -Subcutaneous Lovenox   All the  records are reviewed and case discussed with ED provider. The plan of care was discussed in details with the patient (and family). I answered all questions. The patient agreed to proceed with the above mentioned plan. Further management will depend upon hospital course.   CODE STATUS: Full code  Status is: Inpatient  Remains inpatient appropriate because:Ongoing active pain requiring inpatient pain management, Ongoing diagnostic testing needed not appropriate for outpatient work up, Unsafe d/c plan, IV treatments appropriate due to intensity of illness or inability to take PO and Inpatient level of care appropriate due to severity of illness   Dispo: The patient is from: Home              Anticipated d/c is to: Home              Anticipated d/c date is: 2 days              Patient currently is not medically stable to d/c. The patient will likely require more than 2 midnights of hospitalization for IV antibiotic therapy, hydration given her sepsis and for completion of blood and urine cultures.   TOTAL TIME TAKING CARE OF THIS PATIENT: 55 minutes.    Hannah Beat M.D on 03/17/2020 at 6:54 PM  [facility]   From 7 PM-7 AM, contact night-coverage [email or web address]  CC: Primary care physician; Patient, No Pcp Per   Note: This dictation was prepared with Dragon dictation along with smaller phrase technology. Any transcriptional typo errors that result from this process are unintentional.

## 2020-03-17 NOTE — ED Triage Notes (Signed)
Per [facility] pt from home for abd pains x 5 days. Pain gotten worse. Left upper abd pains. denies n/v/d until EMS moved her at the house and she vomited. Pt was at [facility] ED yesterday and left AMA.  Vitals: 124HR, 20R, CBG 120, 102 temp. 96% on RA. IV 18g in left AC zofran 4mg , fentanyl 50 mcg, tylenol 1000mg  PO.

## 2020-03-17 NOTE — ED Provider Notes (Signed)
[facility] DEPT Provider Note   CSN: [id number] Arrival date & time: 03/17/20  1602     History Chief Complaint  Patient presents with   Abdominal Pain    MARLAINA Horn is a 39 y.o. female with past medical history significant for hemorrhoids, trichomonas vaginitis that has been treated.  HPI Patient is presenting to emergency department today via EMS with chief complaint of progressively worsening abdominal pain and back pain x5 days.  Patient states she went to [facility] ED yesterday however left after a prolonged wait time of 5 hours. She was not evaluated by provider.  She states the pain is located in her lower back, worse on the left side compared to the right.  She states the pain radiates through to her left upper quadrant. She rates the pain 10/10 in severity.  She states the pain is constant.  She describes the pain as sharp. She denies any nausea or emesis until EMS picked her up today when she had one episode of nonbloody nonbilious emesis. She is also endorsing dysuria, urinary frequency, and chills.  She was febrile to 102 and received tylenol as well as zofran and 50 mcg fentyl by EMS in route.   She is sexually active with 1 female partner without protection. She denies chest pain, cough, gross hematuria, pelvic pain, vaginal discharge, abnormal vaginal bleeding, diarrhea, blood in stool. LMP was x 1 week ago. Denies history of kidney stones.     History reviewed. No pertinent past medical history.  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 No pertinent surgical history.   OB History    Gravida  6   Para  4   Term  4   Preterm      AB  1   Living  4     SAB  1   TAB      Ectopic      Multiple      Live Births  4           No family  history on file.  Social History   Tobacco Use   Smoking status: Current Every Day Smoker    Packs/day: 1.00    Types: Cigarettes   Smokeless tobacco: Never Used  Vaping Use   Vaping Use: Never used  Substance Use Topics   Alcohol use: No   Drug use: No    Home Medications Prior to Admission medications   Medication Sig Start Date End Date Taking? Authorizing Provider  acetaminophen (TYLENOL) 500 MG tablet Take 500 mg by mouth every 6 (six) hours as needed for moderate pain.   Yes [provider]  albuterol (VENTOLIN HFA) 108 (90 Base) MCG/ACT inhaler Inhale 2 puffs into the lungs every 6 (six) hours as needed for wheezing or shortness of breath.  01/02/20  Yes [provider]  traMADol (ULTRAM) 50 MG tablet Take 1 tablet (50 mg total) by mouth every 6 (six) hours as needed. Patient taking differently: Take 50 mg by mouth every 6 (six) hours as needed for moderate pain (migraines).  11/29/18  Yes Laury Deep, CNM  docusate sodium (COLACE) 100 MG capsule Take 1 capsule (100 mg total) by mouth 2 (two) times daily. Patient not taking: Reported on 03/17/2020 11/29/18   Laury Deep, CNM  hydrocortisone (ANUSOL-HC) 2.5 % rectal cream Place 1 application rectally 2 (two) times daily. Patient not taking: Reported on 03/17/2020 11/29/18   Raelyn Mora, CNM  polyethylene glycol [facility]) packet Take 17 g by mouth daily as needed for moderate constipation. Patient not taking: Reported on 03/17/2020 11/29/18   Raelyn Mora, CNM    Allergies    Latex and Penicillins  Review of Systems   Review of Systems All other systems are reviewed and are negative for acute change except as noted in the HPI.  Physical Exam Updated Vital Signs BP (!) 128/104    Pulse (!) 140    Temp (!) 102.6 F (39.2 C)    Resp 16    SpO2 99%   Physical Exam Vitals and nursing note reviewed.  Constitutional:      General: She is not in acute distress.    Appearance: She is  ill-appearing.  HENT:     Head: Normocephalic and atraumatic.     Right Ear: Tympanic membrane and external ear normal.     Left Ear: Tympanic membrane and external ear normal.     Nose: Nose normal.     Mouth/Throat:     Mouth: Mucous membranes are moist.     Pharynx: Oropharynx is clear.  Eyes:     General: No scleral icterus.       Right eye: No discharge.        Left eye: No discharge.     Extraocular Movements: Extraocular movements intact.     Conjunctiva/sclera: Conjunctivae normal.     Pupils: Pupils are equal, round, and reactive to light.  Neck:     Vascular: No JVD.  Cardiovascular:     Rate and Rhythm: Regular rhythm. Tachycardia present.     Pulses: Normal pulses.          Radial pulses are 2+ on the right side and 2+ on the left side.     Heart sounds: Normal heart sounds.  Pulmonary:     Comments: Lungs clear to auscultation in all fields. Symmetric chest rise. No wheezing, rales, or rhonchi. Abdominal:     General: Bowel sounds are normal.     Comments: Abdomen is soft.  Tenderness palpation of left upper quadrant with voluntary guarding.  No rigidity.  No peritoneal signs.  Patient has exquisite left CVA tenderness, no right CVA tenderness.  Musculoskeletal:        General: Normal range of motion.     Cervical back: Normal range of motion.  Skin:    General: Skin is warm and dry.     Capillary Refill: Capillary refill takes less than 2 seconds.  Neurological:     Mental Status: She is oriented to person, place, and time.     GCS: GCS eye subscore is 4. GCS verbal subscore is 5. GCS motor subscore is 6.     Comments: Fluent speech, no facial droop.  Psychiatric:        Behavior: Behavior normal.     ED Results / Procedures / Treatments   Labs (all labs ordered are listed, but only abnormal results are displayed) Labs Reviewed  LACTIC ACID, PLASMA - Abnormal; Notable for the following components:      Result Value   Lactic Acid, Venous 2.8 (*)    All  other components within normal limits  COMPREHENSIVE METABOLIC PANEL - Abnormal; Notable for the following components:   Potassium 3.3 (*)    Glucose, Bld 146 (*)  Calcium 8.7 (*)    Albumin 3.3 (*)    AST 43 (*)    All other components within normal limits  CBC WITH DIFFERENTIAL/PLATELET - Abnormal; Notable for the following components:   WBC 16.9 (*)    Neutro Abs 14.9 (*)    Abs Immature Granulocytes 0.12 (*)    All other components within normal limits  URINALYSIS, ROUTINE W REFLEX MICROSCOPIC - Abnormal; Notable for the following components:   APPearance HAZY (*)    Hgb urine dipstick LARGE (*)    Ketones, ur 20 (*)    Protein, ur 100 (*)    Leukocytes,Ua SMALL (*)    WBC, UA >50 (*)    All other components within normal limits  SARS CORONAVIRUS 2 BY RT PCR (HOSPITAL ORDER, PERFORMED IN [facility])  CULTURE, BLOOD (ROUTINE X 2)  CULTURE, BLOOD (ROUTINE X 2)  URINE CULTURE  PREGNANCY, URINE  LACTIC ACID, PLASMA    EKG EKG Interpretation  Date/Time:  Sunday March 17 2020 16:45:34 EDT Ventricular Rate:  110 PR Interval:    QRS Duration: 82 QT Interval:  355 QTC Calculation: 481 R Axis:   78 Text Interpretation: Sinus tachycardia Right atrial enlargement Consider left ventricular hypertrophy Baseline wander in lead(s) II III aVR aVF V2 V3 V4 V6 Confirmed by Raeford Razor [phone]) on 03/17/2020 5:39:37 PM   Radiology CT ABDOMEN PELVIS W CONTRAST  Result Date: 03/17/2020 CLINICAL DATA:  Abdominal pain for 5 days.  Fever. EXAM: CT ABDOMEN AND PELVIS WITH CONTRAST TECHNIQUE: Multidetector CT imaging of the abdomen and pelvis was performed using the standard protocol following bolus administration of intravenous contrast. CONTRAST:  OMNIPAQUE IOHEXOL 300 MG/ML  SOLN COMPARISON:  None. FINDINGS: Lower chest: Lung bases are clear. Hepatobiliary: No focal liver abnormality is seen. No gallstones, gallbladder wall thickening, or biliary dilatation. Pancreas:  No ductal dilatation or inflammation. Spleen: Normal in size without focal abnormality. Adrenals/Urinary Tract: Normal adrenal glands. There is heterogeneous enhancement of the left kidney with mild left ureteral enhancement and thickening. Mild left perinephric edema. No renal or ureteral calculi. No evidence of focal fluid collection. 1 Urinary bladder is minimally distended and not well assessed. Homogeneous enhancement of the right kidney. Subcentimeter low-density in the periphery of the right kidney is likely cyst. Stomach/Bowel: Bowel evaluation is limited in the absence of enteric contrast and paucity of intra-abdominal fat. Stomach is nondistended. There is no evidence of small-bowel obstruction. No obvious small bowel inflammation. Normal appendix, for example series 5, image 59. Cecum is located in the deep mid pelvis. Moderate volume of stool in the ascending and transverse colon. There is transverse and sigmoid colonic redundancy. No colonic wall thickening or inflammation. Vascular/Lymphatic: Normal caliber abdominal aorta. The portal vein is patent. There are few prominent retroperitoneal nodes are likely reactive. Reproductive: The uterus is anteverted. Small physiologic cysts in the left ovary. Right ovary is not definitively visualized. Cystic structures appear to represent fluid within small bowel rather than adnexal lesion. Other: Small amount of free fluid in the dependent pelvis. No free air. No focal fluid collection. Musculoskeletal: There are no acute or suspicious osseous abnormalities. IMPRESSION: Left pyelonephritis.  No renal abscess or calculi. Electronically Signed   By: Narda Rutherford M.D.   On: 03/17/2020 18:19   DG Chest Port 1 View  Result Date: 03/17/2020 CLINICAL DATA:  Sepsis, back pain, current smoker EXAM: PORTABLE CHEST 1 VIEW COMPARISON:  11/22/2018 chest radiograph. FINDINGS: Stable cardiomediastinal silhouette with normal heart  size. No pneumothorax. No pleural  effusion. Lungs appear clear, with no acute consolidative airspace disease and no pulmonary edema. IMPRESSION: No active disease. Electronically Signed   By: Delbert Phenix M.D.   On: 03/17/2020 17:12    Procedures .Critical Care Performed by: Sherene Sires, PA-C Authorized by: Sherene Sires, PA-C   Critical care provider statement:    Critical care time (minutes):  38   Critical care was necessary to treat or prevent imminent or life-threatening deterioration of the following conditions:  Sepsis   Critical care was time spent personally by me on the following activities:  Development of treatment plan with patient or surrogate, discussions with consultants, discussions with primary provider, evaluation of patient's response to treatment, examination of patient, obtaining history from patient or surrogate, ordering and performing treatments and interventions, ordering and review of laboratory studies, ordering and review of radiographic studies, pulse oximetry, re-evaluation of patient's condition and review of old charts   (including critical care time)  Medications Ordered in ED Medications  sodium chloride (PF) 0.9 % injection (has no administration in time range)  lactated ringers bolus 1,000 mL (1,000 mLs Intravenous New Bag/Given 03/17/20 1713)    And  lactated ringers bolus 500 mL (500 mLs Intravenous New Bag/Given 03/17/20 1714)    And  lactated ringers bolus 250 mL (250 mLs Intravenous New Bag/Given 03/17/20 1734)  metroNIDAZOLE (FLAGYL) IVPB 500 mg (500 mg Intravenous New Bag/Given 03/17/20 1713)  ibuprofen (ADVIL) tablet 600 mg (600 mg Oral Given 03/17/20 1706)  cefTRIAXone (ROCEPHIN) 2 g in sodium chloride 0.9 % 100 mL IVPB (2 g Intravenous New Bag/Given 03/17/20 1714)  iohexol (OMNIPAQUE) 300 MG/ML solution 100 mL (100 mLs Intravenous Contrast Given 03/17/20 1748)    ED Course  I have reviewed the triage vital signs and the nursing notes.  Pertinent labs & imaging  results that were available during my care of the patient were reviewed by me and considered in my medical decision making (see chart for details).  Clinical Course as of Mar 17 1838  Sun Mar 17, 2020  1612 Code sepsis initiated on arrival to ED based on fever and tachycardia. Possible intra-abdominal source   [KA]    Clinical Course User Index [KA] Sherene Sires, PA-C   Vitals:   03/17/20 1808 03/17/20 1810 03/17/20 1817 03/17/20 1832  BP:      Pulse: 100  93 96  Resp: (!) 22  18 19   Temp:  98.1 F (36.7 C)    TempSrc:  Oral    SpO2: 98%  99% 97%     MDM Rules/Calculators/A&P                          History provided by patient with additional history obtained from chart review.    Patient presents to the ED with complaints of abdominal pain.  Patient is ill-appearing.  On arrival she is febrile to 102.6 and tachycardic to 140.  On exam she has exquisite left CVA tenderness as well as tenderness palpation of left upper quadrant without peritoneal signs.  Code sepsis initiated with antibiotic started for intra-abdominal infection.  Patient has a penicillin allergy with reaction of swelling or rash.  She has received Rocephin in the past adverse reaction, Rocephin and Flagyl ordered. Will give 30cc/kg fluid bolus with LR. Suspect pyelonephritis based on physical exam.  She received Tylenol from EMS, still febrile ibuprofen given.  I viewed patient's labs that  were drawn in triage yesterday when patient LWBS. Results were notable for mild leukocytosis of 13.5, I stat beta hCG was 8.6.  Urine pregnancy today is negative. She has worsening leukocytosis of 17.9, no anemia.  CMP shows no severe electrolyte derangement, no transaminitis, no renal insufficiency. Lactic acidosis of 2.8, when rechecked is down trending at 0.9. UA shows infection with small leukocytes and over 50 WBC.  Urine culture sent. Blood cultures pending. Covid negative.  I viewed pt's chest xray and it does not  suggest acute infectious processes CT abdominal pelvis shows left pyelonephritis without kidney stone.  On reassessment tachycardia has resolved.  She is now afebrile.This case was discussed with Dr. Juleen ChinaKohut who has seen the patient and agrees with plan to admit. Spoke with Dr. Arville CareMansy with hospitalist service who agrees to assume care of patient and bring into the hospital for further evaluation and management.     Portions of this note were generated with Scientist, clinical (histocompatibility and immunogenetics)Dragon dictation software. Dictation errors may occur despite best attempts at proofreading.   Final Clinical Impression(s) / ED Diagnoses Final diagnoses:  Sepsis without acute organ dysfunction, due to unspecified organism [facility](HCC)  Pyelonephritis    Rx / DC Orders ED Discharge Orders    None       Kathyrn Lasslbrizze, Lechelle Wrigley E, PA-C 03/17/20 1943    Raeford RazorKohut, Stephen, MD 03/17/20 2013

## 2020-03-17 NOTE — Progress Notes (Signed)
A consult was received from an ED physician for levaquin per pharmacy dosing.  The patient's profile has been reviewed for ht/wt/allergies/indication/available labs.    Of note, patient reported swelling and rash with PCN, but had ceftriaxone and cephalexin in the past.  Will change levaquin to ceftriaxone per hospital policy.  A one time order has been placed for ceftriaxone 2gm IV x1.  Further antibiotics/pharmacy consults should be ordered by admitting physician if indicated.                       Thank you, Lucia Gaskins 03/17/2020  4:38 PM

## 2020-03-18 ENCOUNTER — Other Ambulatory Visit: Payer: Self-pay

## 2020-03-18 ENCOUNTER — Encounter (HOSPITAL_COMMUNITY): Payer: Self-pay | Admitting: Family Medicine

## 2020-03-18 DIAGNOSIS — A419 Sepsis, unspecified organism: Principal | ICD-10-CM

## 2020-03-18 DIAGNOSIS — Z720 Tobacco use: Secondary | ICD-10-CM

## 2020-03-18 DIAGNOSIS — N390 Urinary tract infection, site not specified: Secondary | ICD-10-CM

## 2020-03-18 DIAGNOSIS — N10 Acute tubulo-interstitial nephritis: Secondary | ICD-10-CM

## 2020-03-18 DIAGNOSIS — E876 Hypokalemia: Secondary | ICD-10-CM

## 2020-03-18 LAB — PROTIME-INR
INR: 1.2 (ref 0.8–1.2)
Prothrombin Time: 14.3 seconds (ref 11.4–15.2)

## 2020-03-18 LAB — CBC
HCT: 37.1 % (ref 36.0–46.0)
Hemoglobin: 11.8 g/dL — ABNORMAL LOW (ref 12.0–15.0)
MCH: 28.6 pg (ref 26.0–34.0)
MCHC: 31.8 g/dL (ref 30.0–36.0)
MCV: 89.8 fL (ref 80.0–100.0)
Platelets: 249 10*3/uL (ref 150–400)
RBC: 4.13 MIL/uL (ref 3.87–5.11)
RDW: 15.2 % (ref 11.5–15.5)
WBC: 10.7 10*3/uL — ABNORMAL HIGH (ref 4.0–10.5)
nRBC: 0 % (ref 0.0–0.2)

## 2020-03-18 LAB — BASIC METABOLIC PANEL
Anion gap: 11 (ref 5–15)
BUN: 5 mg/dL — ABNORMAL LOW (ref 6–20)
CO2: 28 mmol/L (ref 22–32)
Calcium: 8.6 mg/dL — ABNORMAL LOW (ref 8.9–10.3)
Chloride: 102 mmol/L (ref 98–111)
Creatinine, Ser: 0.58 mg/dL (ref 0.44–1.00)
GFR calc Af Amer: >60 mL/min (ref >60)
GFR calc non Af Amer: >60 mL/min (ref >60)
Glucose, Bld: 100 mg/dL — ABNORMAL HIGH (ref 70–99)
Potassium: 3.7 mmol/L (ref 3.5–5.1)
Sodium: 141 mmol/L (ref 135–145)

## 2020-03-18 LAB — CORTISOL-AM, BLOOD: Cortisol - AM: 8.7 ug/dL (ref 6.7–22.6)

## 2020-03-18 LAB — HIV ANTIBODY (ROUTINE TESTING W REFLEX): HIV Screen 4th Generation wRfx: NONREACTIVE

## 2020-03-18 LAB — PROCALCITONIN: Procalcitonin: 7.28 ng/mL

## 2020-03-18 NOTE — Progress Notes (Signed)
PROGRESS NOTE    EVAGELIA Horn  XVQ:[id number] DOB: 1981/04/23 DOA: 03/17/2020 PCP: Patient, No Pcp Per   Brief Narrative: Rachel Horn is a 39 y.o. female with a history of tobacco abuse. Patient presented secondary to fever, chills and left flank pain and found to have evidence of acute pyelonephritis. Patient started on IV antibiotics.   Assessment & Plan:   Principal Problem:   Acute pyelonephritis Active Problems:   Sepsis secondary to UTI (HCC)   Tobacco abuse   Hypokalemia   Acute pyelonephritis, Left Patient started on Ceftriaxone IV. Urine and blood cultures obtained on admission. -Continue Ceftriaxone -Follow up culture reports  Sepsis Secondary to UTI/pyelonephritis. Present on admission. Procalcitonin of 7.28. Physiology improved.  Tobacco abuse -Continue Nicotine patch  Hypokalemia Present on admission. Given supplementation with resolution.  DVT prophylaxis: Lovenox Code Status:   Code Status: Full Code Family Communication: None at bedside Disposition Plan: Anticipate discharge in 1-2 days, more likely 2 days pending clinical improvement, culture results and transition to oral regimen.   Consultants:   None  Procedures:   None  Antimicrobials:  Ceftriaxone IV    Subjective: Nausea/vomiting this morning with breakfast. Overall doing better than on admission. Still with flank pain.  Objective: Vitals:   03/17/20 1832 03/17/20 2203 03/18/20 0529 03/18/20 1012  BP:  100/63 98/67 114/85  Pulse: 96 82 78 87  Resp: 19 18 17 16   Temp:  97.9 F (36.6 C) 97.8 F (36.6 C) 98 F (36.7 C)  TempSrc:  Oral Oral Oral  SpO2: 97% 100% 100% 100%    Intake/Output Summary (Last 24 hours) at 03/18/2020 1112 Last data filed at 03/18/2020 0600 Gross per 24 hour  Intake 3290 ml  Output 0 ml  Net 3290 ml   There were no vitals filed for this visit.  Examination:  General exam: Appears calm and comfortable Respiratory system: Clear to  auscultation. Respiratory effort normal. Cardiovascular system: S1 & S2 heard, RRR. No murmurs, rubs, gallops or clicks. Gastrointestinal system: Abdomen is nondistended, soft and tender in left upper quadrant/Flank. No organomegaly or masses felt. Normal bowel sounds heard. Central nervous system: Alert and oriented. No focal neurological deficits. Musculoskeletal: No edema. No calf tenderness Skin: No cyanosis. No rashes Psychiatry: Judgement and insight appear normal. Mood & affect appropriate.     Data Reviewed: I have personally reviewed following labs and imaging studies  CBC Lab Results  Component Value Date   WBC 10.7 (H) 03/18/2020   RBC 4.13 03/18/2020   HGB 11.8 (L) 03/18/2020   HCT 37.1 03/18/2020   MCV 89.8 03/18/2020   MCH 28.6 03/18/2020   PLT 249 03/18/2020   MCHC 31.8 03/18/2020   RDW 15.2 03/18/2020   LYMPHSABS 0.8 03/17/2020   MONOABS 1.0 03/17/2020   EOSABS 0.0 03/17/2020   BASOSABS 0.0 03/17/2020     Last metabolic panel Lab Results  Component Value Date   NA 141 03/18/2020   K 3.7 03/18/2020   CL 102 03/18/2020   CO2 28 03/18/2020   BUN 5 (L) 03/18/2020   CREATININE 0.58 03/18/2020   GLUCOSE 100 (H) 03/18/2020   GFRNONAA >60 03/18/2020   GFRAA >60 03/18/2020   CALCIUM 8.6 (L) 03/18/2020   PROT 7.1 03/17/2020   ALBUMIN 3.3 (L) 03/17/2020   BILITOT 0.7 03/17/2020   ALKPHOS 97 03/17/2020   AST 43 (H) 03/17/2020   ALT 34 03/17/2020   ANIONGAP 11 03/18/2020    CBG (last 3)  No results for  input(s): GLUCAP in the last 72 hours.   GFR: CrCl cannot be calculated (Unknown ideal weight.).  Coagulation Profile: Recent Labs  Lab 03/17/20 1859 03/18/20 0442  INR 1.2 1.2    Recent Results (from the past 240 hour(s))  Blood Culture (routine x 2)     Status: None (Preliminary result)   Collection Time: 03/17/20  4:18 PM   Specimen: BLOOD LEFT HAND  Result Value Ref Range Status   Specimen Description   Final    BLOOD LEFT HAND Performed  at [facility], [address] [place], [place] [address]    Special Requests   Final    BOTTLES DRAWN AEROBIC AND ANAEROBIC Blood Culture adequate volume Performed at [facility], [address], [place], [place] [address]    Culture   Final    NO GROWTH < 24 HOURS Performed at [facility], [address] [place], [place] [address]    Report Status PENDING  Incomplete  SARS Coronavirus 2 by RT PCR (hospital order, performed in [facility] hospital lab) Nasopharyngeal Nasopharyngeal Swab     Status: None   Collection Time: 03/17/20  4:20 PM   Specimen: Nasopharyngeal Swab  Result Value Ref Range Status   SARS Coronavirus 2 NEGATIVE NEGATIVE Final    Comment: (NOTE) SARS-CoV-2 target nucleic acids are NOT DETECTED.  The SARS-CoV-2 RNA is generally detectable in upper and lower respiratory specimens during the acute phase of infection. The lowest concentration of SARS-CoV-2 viral copies this assay can detect is 250 copies / mL. A negative result does not preclude SARS-CoV-2 infection and should not be used as the sole basis for treatment or other patient management decisions.  A negative result may occur with improper specimen collection / handling, submission of specimen other than nasopharyngeal swab, presence of viral mutation(s) within the areas targeted by this assay, and inadequate number of viral copies (<250 copies / mL). A negative result must be combined with clinical observations, patient history, and epidemiological information.  Fact Sheet for Patients:   BoilerBrush.com.cy  Fact Sheet for Healthcare Providers: https://pope.com/  This test is not yet approved or  cleared by the [place] FDA and has been authorized for detection and/or diagnosis of SARS-CoV-2 by FDA under an Emergency Use Authorization (EUA).  This EUA will remain in effect (meaning this test can be  used) for the duration of the COVID-19 declaration under Section 564(b)(1) of the Act, 21 U.S.C. section 360bbb-3(b)(1), unless the authorization is terminated or revoked sooner.  Performed at [facility], [address], [place], [place] [address]   Blood Culture (routine x 2)     Status: None (Preliminary result)   Collection Time: 03/17/20  4:23 PM   Specimen: BLOOD RIGHT HAND  Result Value Ref Range Status   Specimen Description   Final    BLOOD RIGHT HAND Performed at [facility], [address] [place], [place] [address]    Special Requests   Final    BOTTLES DRAWN AEROBIC AND ANAEROBIC Blood Culture adequate volume Performed at [facility], [address], [place], [place] [address]    Culture   Final    NO GROWTH < 24 HOURS Performed at [facility], [address] [place], [place] [address]    Report Status PENDING  Incomplete        Radiology Studies: CT ABDOMEN PELVIS W CONTRAST  Result Date: 03/17/2020 CLINICAL DATA:  Abdominal pain for 5 days.  Fever. EXAM: CT ABDOMEN AND PELVIS WITH CONTRAST TECHNIQUE: Multidetector CT imaging of the abdomen and pelvis was performed using the standard protocol following bolus administration of intravenous contrast. CONTRAST:  146mL OMNIPAQUE IOHEXOL 300 MG/ML  SOLN COMPARISON:  None. FINDINGS: Lower chest: Lung bases are clear. Hepatobiliary: No focal liver abnormality is seen. No gallstones, gallbladder wall thickening, or biliary dilatation. Pancreas: No ductal dilatation or inflammation. Spleen: Normal in size without focal abnormality. Adrenals/Urinary Tract: Normal adrenal glands. There is heterogeneous enhancement of the left kidney with mild left ureteral enhancement and thickening. Mild left perinephric edema. No renal or ureteral calculi. No evidence of focal fluid collection. 1 Urinary bladder is minimally distended and not well assessed. Homogeneous  enhancement of the right kidney. Subcentimeter low-density in the periphery of the right kidney is likely cyst. Stomach/Bowel: Bowel evaluation is limited in the absence of enteric contrast and paucity of intra-abdominal fat. Stomach is nondistended. There is no evidence of small-bowel obstruction. No obvious small bowel inflammation. Normal appendix, for example series 5, image 59. Cecum is located in the deep mid pelvis. Moderate volume of stool in the ascending and transverse colon. There is transverse and sigmoid colonic redundancy. No colonic wall thickening or inflammation. Vascular/Lymphatic: Normal caliber abdominal aorta. The portal vein is patent. There are few prominent retroperitoneal nodes are likely reactive. Reproductive: The uterus is anteverted. Small physiologic cysts in the left ovary. Right ovary is not definitively visualized. Cystic structures appear to represent fluid within small bowel rather than adnexal lesion. Other: Small amount of free fluid in the dependent pelvis. No free air. No focal fluid collection. Musculoskeletal: There are no acute or suspicious osseous abnormalities. IMPRESSION: Left pyelonephritis.  No renal abscess or calculi. Electronically Signed   By: Keith Rake M.D.   On: 03/17/2020 18:19   DG Chest Port 1 View  Result Date: 03/17/2020 CLINICAL DATA:  Sepsis, back pain, current smoker EXAM: PORTABLE CHEST 1 VIEW COMPARISON:  11/22/2018 chest radiograph. FINDINGS: Stable cardiomediastinal silhouette with normal heart size. No pneumothorax. No pleural effusion. Lungs appear clear, with no acute consolidative airspace disease and no pulmonary edema. IMPRESSION: No active disease. Electronically Signed   By: Ilona Sorrel M.D.   On: 03/17/2020 17:12        Scheduled Meds: . enoxaparin (LOVENOX) injection  40 mg Subcutaneous Q24H  . nicotine  14 mg Transdermal Daily   Continuous Infusions: . 0.9 % NaCl with KCl 20 mEq / L 100 mL/hr at 03/18/20 1018  .  cefTRIAXone (ROCEPHIN)  IV       LOS: 1 day     Cordelia Poche, MD [facility] 03/18/2020, 11:12 AM  If 7PM-7AM, please contact night-coverage [email or web address]

## 2020-03-18 NOTE — Progress Notes (Signed)
Notified MD of patient claiming shortness of breath and a little chest pain.  Pt was breathing fast upon entering room, started to cry.  Calmed her down and check vitals. Vitals were good, pulse was initially erratic but eventually stabilized.  I asked her she felt anxious and she said "a little". Order given.

## 2020-03-19 LAB — URINE CULTURE: Culture: 60000 — AB

## 2020-03-19 MED ORDER — CEFDINIR 300 MG PO CAPS
300.0000 mg | ORAL_CAPSULE | Freq: Two times a day (BID) | ORAL | 0 refills | Status: AC
Start: 2020-03-19 — End: 2020-03-27

## 2020-03-19 MED ORDER — TRAMADOL HCL 50 MG PO TABS: 50.0000 mg | ORAL_TABLET | Freq: Four times a day (QID) | ORAL | 0 refills | Status: AC | PRN

## 2020-03-19 NOTE — Discharge Instructions (Signed)
Pyelonephritis, Adult °Pyelonephritis is an infection that occurs in the kidney. The kidneys are the organs that filter a person's blood and move waste out of the bloodstream and into the urine. Urine passes from the kidneys, through tubes called ureters, and into the bladder. There are two main types of pyelonephritis: °· Infections that come on quickly without any warning (acute pyelonephritis). °· Infections that last for a long period of time (chronic pyelonephritis). °In most cases, the infection clears up with treatment and does not cause further problems. More severe infections or chronic infections can sometimes spread to the bloodstream or lead to other problems with the kidneys. °What are the causes? °This condition is usually caused by: °· Bacteria traveling from the bladder up to the kidney. This may occur after having a bladder infection (cystitis) or urinary tract infection (UTI). °· Bladder infections caused from bacteria traveling from the bloodstream to the kidney. °What increases the risk? °This condition is more likely to develop in: °· Pregnant women. °· Older people. °· People who have any of these conditions: °? Diabetes. °? Inflammation of the prostate gland (prostatitis), in males. °? Kidney stones or bladder stones. °? Other abnormalities of the kidney or ureter. °? Cancer. °· People who have a catheter placed in the bladder. °· People who are sexually active. °· Women who use spermicides. °· People who have had a prior UTI. °What are the signs or symptoms? °Symptoms of this condition include: °· Frequent urination. °· Strong or persistent urge to urinate. °· Burning or stinging when urinating. °· Abdominal pain. °· Back pain. °· Pain in the side or flank area. °· Fever or chills. °· Blood in the urine, or dark urine. °· Nausea or vomiting. °How is this diagnosed? °This condition may be diagnosed based on: °· Your medical history and a physical exam. °· Urine tests. °· Blood tests. °You may  also have imaging tests of the kidneys, such as an ultrasound or CT scan. °How is this treated? °Treatment for this condition may depend on the severity of the infection. °· If the infection is mild and is found early, you may be treated with antibiotic medicines taken by mouth (orally). You will need to drink fluids to remain hydrated. °· If the infection is more severe, you may need to stay in the hospital and receive antibiotics given directly into a vein through an IV. You may also need to receive fluids through an IV if you are not able to remain hydrated. After your hospital stay, you may need to take oral antibiotics for a period of time. °Other treatments may be required, depending on the cause of the infection. °Follow these instructions at home: °Medicines °· Take your antibiotic medicine as told by your health care provider. Do not stop taking the antibiotic even if you start to feel better. °· Take over-the-counter and prescription medicines only as told by your health care provider. °General instructions ° °· Drink enough fluid to keep your urine pale yellow. °· Avoid caffeine, tea, and carbonated beverages. They tend to irritate the bladder. °· Urinate often. Avoid holding in urine for long periods of time. °· Urinate before and after sex. °· After a bowel movement, women should cleanse from front to back. Use each tissue only once. °· Keep all follow-up visits as told by your health care provider. This is important. °Contact a health care provider if: °· Your symptoms do not get better after 2 days of treatment. °· Your symptoms get worse. °·   You have a fever. °Get help right away if you: °· Are unable to take your antibiotics or fluids. °· Have shaking chills. °· Vomit. °· Have severe flank or back pain. °· Have extreme weakness or fainting. °Summary °· Pyelonephritis is a urinary tract infection (UTI) that occurs in the kidney. °· Treatment for this condition may depend on the severity of the  infection. °· Take your antibiotic medicine as told by your health care provider. Do not stop taking the antibiotic even if you start to feel better. °· Drink enough fluid to keep your urine pale yellow. °· Keep all follow-up visits as told by your health care provider. This is important. °This information is not intended to replace advice given to you by your health care provider. Make sure you discuss any questions you have with your health care provider. °Document Revised: 07/26/2018 Document Reviewed: 07/26/2018 °[employer]vier Patient Education © 2020 [employer] ° °

## 2020-03-19 NOTE — Discharge Summary (Signed)
Physician Discharge Summary  Rachel Horn UJW:[id number] DOB: [birth year] DOA: 03/17/2020  PCP: Jackie Plum, MD  Admit date: 03/17/2020 Discharge date: 03/19/2020  Admitted From: Home Disposition: Home  Recommendations for Outpatient Follow-up:  1. Follow up with PCP in 1 week 2. Please obtain BMP/CBC in one week 3. Please follow up on the following pending results: Blood culture final report  Home Health: None Equipment/Devices: None  Discharge Condition: Stable CODE STATUS: Full code Diet recommendation: Regular diet   Brief/Interim Summary:  Admission HPI written by Hannah Beat, MD   HISTORY OF PRESENT ILLNESS: Rachel Horn  is a 39 y.o. female with a known history of ongoing tobacco abuse, presented to the emergency room with acute onset of fever and chills today with back pain as well as left flank pain for the last 10 days which have been worsening over the last 5 days with associated urinary frequency and urgency without dysuria or hematuria.  She admitted to nausea and vomiting once.  She has been having left upper quadrant abdominal pain and tenderness.  She denies any cough or shortness of breath or wheezing.  No rhinorrhea or nasal congestion or sore throat or earache.  Upon presentation to the emergency room, temperature was initially 102.6 with heart rate of 102 that was up to 140 then down to 108 and briefly blood pressure was 92/67 and later was up to 108/67 with hydration.  Labs revealed hypokalemia 3.3 and a serum lipase of 43 with otherwise unremarkable CMP.  Lactic acid was 2.8 and later 0.9 and CBC showed leukocytosis 16.9 compared to 13.5 yesterday.  Urinalysis was positive for UTI.  Blood and urine culture was sent.  Abdominal pelvic CT scan showed left pyelonephritis with no renal abscess or calculi.  The patient was initially given IV ceftriaxone and Flagyl as well as 600 mg p.o. ibuprofen and 1 L bolus of IV lactated Ringer at the other 750 mL.   She will be admitted to a medical bed for further evaluation and management.  Hospital course:  Acute pyelonephritis, Left Patient started on Ceftriaxone IV. Urine and blood cultures obtained on admission. Urine culture significant for E. Coli, pan-sensitive. Blood cultures with no growth to date. Patient transitioned to Cefdinir on discharge to complete a 10 day course.  Sepsis Secondary to UTI/pyelonephritis. Present on admission. Procalcitonin of 7.28. Physiology improved.  Tobacco abuse Nicotine patch while inpatient. Counseled on admission.  Heart murmur Slight. Recommend outpatient follow-up. Blood culture negative.  Hypokalemia Present on admission. Given supplementation with resolution.  Discharge Diagnoses:  Principal Problem:   Acute pyelonephritis Active Problems:   Sepsis secondary to UTI [facility])   Tobacco abuse   Hypokalemia    Discharge Instructions  Discharge Instructions    Call MD for:  severe uncontrolled pain   Complete by: As directed    Call MD for:  temperature >100.4   Complete by: As directed    Diet - low sodium heart healthy   Complete by: As directed    Increase activity slowly   Complete by: As directed      Allergies as of 03/19/2020      Reactions   Latex Swelling, Rash   Penicillins Swelling, Rash   Did it involve swelling of the face/tongue/throat, SOB, or low BP? Unknown Did it involve sudden or severe rash/hives, skin peeling, or any reaction on the inside of your mouth or nose? Yes Did you need to seek medical attention at a hospital or  doctor's office? Unknown When did it last happen?unk If all above answers are "NO", may proceed with cephalosporin use.      Medication List    STOP taking these medications   docusate sodium 100 MG capsule Commonly known as: COLACE   hydrocortisone 2.5 % rectal cream Commonly known as: Anusol-HC   polyethylene glycol 17 g packet Commonly known as: MIRALAX / GLYCOLAX     TAKE  these medications   acetaminophen 500 MG tablet Commonly known as: TYLENOL Take 500 mg by mouth every 6 (six) hours as needed for moderate pain.   albuterol 108 (90 Base) MCG/ACT inhaler Commonly known as: VENTOLIN HFA Inhale 2 puffs into the lungs every 6 (six) hours as needed for wheezing or shortness of breath.   cefdinir 300 MG capsule Commonly known as: OMNICEF Take 1 capsule (300 mg total) by mouth 2 (two) times daily for 8 days.   traMADol 50 MG tablet Commonly known as: ULTRAM Take 1 tablet (50 mg total) by mouth every 6 (six) hours as needed for up to 5 days for moderate pain. What changed: reasons to take this       Follow-up Information    Osei-Bonsu, Greggory Stallion, MD. Schedule an appointment as soon as possible for a visit in 1 week(s).   Specialty: Internal Medicine Why: Hospital follow-up/murmur Contact information: [address] [place] [address] [phone]              Allergies  Allergen Reactions  . Latex Swelling and Rash  . Penicillins Swelling and Rash    Did it involve swelling of the face/tongue/throat, SOB, or low BP? Unknown Did it involve sudden or severe rash/hives, skin peeling, or any reaction on the inside of your mouth or nose? Yes Did you need to seek medical attention at a hospital or doctor's office? Unknown When did it last happen?unk If all above answers are "NO", may proceed with cephalosporin use.     Consultations:  None   Procedures/Studies: CT ABDOMEN PELVIS W CONTRAST  Result Date: 03/17/2020 CLINICAL DATA:  Abdominal pain for 5 days.  Fever. EXAM: CT ABDOMEN AND PELVIS WITH CONTRAST TECHNIQUE: Multidetector CT imaging of the abdomen and pelvis was performed using the standard protocol following bolus administration of intravenous contrast. CONTRAST:  OMNIPAQUE IOHEXOL 300 MG/ML  SOLN COMPARISON:  None. FINDINGS: Lower chest: Lung bases are clear. Hepatobiliary: No focal liver abnormality is  seen. No gallstones, gallbladder wall thickening, or biliary dilatation. Pancreas: No ductal dilatation or inflammation. Spleen: Normal in size without focal abnormality. Adrenals/Urinary Tract: Normal adrenal glands. There is heterogeneous enhancement of the left kidney with mild left ureteral enhancement and thickening. Mild left perinephric edema. No renal or ureteral calculi. No evidence of focal fluid collection. 1 Urinary bladder is minimally distended and not well assessed. Homogeneous enhancement of the right kidney. Subcentimeter low-density in the periphery of the right kidney is likely cyst. Stomach/Bowel: Bowel evaluation is limited in the absence of enteric contrast and paucity of intra-abdominal fat. Stomach is nondistended. There is no evidence of small-bowel obstruction. No obvious small bowel inflammation. Normal appendix, for example series 5, image 59. Cecum is located in the deep mid pelvis. Moderate volume of stool in the ascending and transverse colon. There is transverse and sigmoid colonic redundancy. No colonic wall thickening or inflammation. Vascular/Lymphatic: Normal caliber abdominal aorta. The portal vein is patent. There are few prominent retroperitoneal nodes are likely reactive. Reproductive: The uterus is anteverted. Small physiologic  cysts in the left ovary. Right ovary is not definitively visualized. Cystic structures appear to represent fluid within small bowel rather than adnexal lesion. Other: Small amount of free fluid in the dependent pelvis. No free air. No focal fluid collection. Musculoskeletal: There are no acute or suspicious osseous abnormalities. IMPRESSION: Left pyelonephritis.  No renal abscess or calculi. Electronically Signed   By: Narda Rutherford M.D.   On: 03/17/2020 18:19   DG Chest Port 1 View  Result Date: 03/17/2020 CLINICAL DATA:  Sepsis, back pain, current smoker EXAM: PORTABLE CHEST 1 VIEW COMPARISON:  11/22/2018 chest radiograph. FINDINGS: Stable  cardiomediastinal silhouette with normal heart size. No pneumothorax. No pleural effusion. Lungs appear clear, with no acute consolidative airspace disease and no pulmonary edema. IMPRESSION: No active disease. Electronically Signed   By: Delbert Phenix M.D.   On: 03/17/2020 17:12     Subjective: Pain is improved. Tolerating diet.  Discharge Exam: Vitals:   03/18/20 2142 03/19/20 0602  BP: (!) 113/95 117/82  Pulse: 93 96  Resp: 14 16  Temp: 98.7 F (37.1 C) 98.6 F (37 C)  SpO2: 99% 100%   Vitals:   03/18/20 1405 03/18/20 1650 03/18/20 2142 03/19/20 0602  BP: 125/86 115/79 (!) 113/95 117/82  Pulse: (!) 107 78 93 96  Resp: 16 16 14 16   Temp: 99.2 F (37.3 C) 97.6 F (36.4 C) 98.7 F (37.1 C) 98.6 F (37 C)  TempSrc: Oral Oral Oral Oral  SpO2: 100% 100% 99% 100%    General: Pt is alert, awake, not in acute distress Cardiovascular: RRR, S1/S2 +, no rubs, no gallops, 1/6 systolic murmur Respiratory: CTA bilaterally, no wheezing, no rhonchi Abdominal: Soft, NT, ND, bowel sounds + Extremities: no edema, no cyanosis    The results of significant diagnostics from this hospitalization (including imaging, microbiology, ancillary and laboratory) are listed below for reference.     Microbiology: Recent Results (from the past 240 hour(s))  Blood Culture (routine x 2)     Status: None (Preliminary result)   Collection Time: 03/17/20  4:18 PM   Specimen: BLOOD LEFT HAND  Result Value Ref Range Status   Specimen Description   Final    BLOOD LEFT HAND Performed at [facility], [address] [place], [place]    Special Requests   Final    BOTTLES DRAWN AEROBIC AND ANAEROBIC Blood Culture adequate volume Performed at [facility], [address], [place], [place]    Culture   Final    NO GROWTH 2 DAYS Performed at [facility], [address] [place], [place]    Report Status PENDING  Incomplete    Urine culture     Status: Abnormal   Collection Time: 03/17/20  4:18 PM   Specimen: In/Out Cath Urine  Result Value Ref Range Status   Specimen Description   Final    IN/OUT CATH URINE Performed at [facility], [address] [place], [place]    Special Requests   Final    NONE Performed at [facility], [address], [place], [place]    Culture 60,000 COLONIES/mL ESCHERICHIA COLI (A)  Final   Report Status 03/19/2020 FINAL  Final   Organism ID, Bacteria ESCHERICHIA COLI (A)  Final      Susceptibility   Escherichia coli - MIC*    AMPICILLIN >=32 RESISTANT Resistant     CEFAZOLIN <=4 SENSITIVE Sensitive  CEFTRIAXONE <=1 SENSITIVE Sensitive     CIPROFLOXACIN <=0.25 SENSITIVE Sensitive     GENTAMICIN <=1 SENSITIVE Sensitive     IMIPENEM <=0.25 SENSITIVE Sensitive     NITROFURANTOIN <=16 SENSITIVE Sensitive     TRIMETH/SULFA >=320 RESISTANT Resistant     AMPICILLIN/SULBACTAM 8 SENSITIVE Sensitive     PIP/TAZO <=4 SENSITIVE Sensitive     * 60,000 COLONIES/mL ESCHERICHIA COLI  SARS Coronavirus 2 by RT PCR (hospital order, performed in [facility] hospital lab) Nasopharyngeal Nasopharyngeal Swab     Status: None   Collection Time: 03/17/20  4:20 PM   Specimen: Nasopharyngeal Swab  Result Value Ref Range Status   SARS Coronavirus 2 NEGATIVE NEGATIVE Final    Comment: (NOTE) SARS-CoV-2 target nucleic acids are NOT DETECTED.  The SARS-CoV-2 RNA is generally detectable in upper and lower respiratory specimens during the acute phase of infection. The lowest concentration of SARS-CoV-2 viral copies this assay can detect is 250 copies / mL. A negative result does not preclude SARS-CoV-2 infection and should not be used as the sole basis for treatment or other patient management decisions.  A negative result may occur with improper specimen collection / handling, submission of specimen other than nasopharyngeal swab,  presence of viral mutation(s) within the areas targeted by this assay, and inadequate number of viral copies (<250 copies / mL). A negative result must be combined with clinical observations, patient history, and epidemiological information.  Fact Sheet for Patients:   BoilerBrush.com.cy  Fact Sheet for Healthcare Providers: https://pope.com/  This test is not yet approved or  cleared by the [place] FDA and has been authorized for detection and/or diagnosis of SARS-CoV-2 by FDA under an Emergency Use Authorization (EUA).  This EUA will remain in effect (meaning this test can be used) for the duration of the COVID-19 declaration under Section 564(b)(1) of the Act, 21 U.S.C. section 360bbb-3(b)(1), unless the authorization is terminated or revoked sooner.  Performed at [facility], [address], [place], [place] [address]   Blood Culture (routine x 2)     Status: None (Preliminary result)   Collection Time: 03/17/20  4:23 PM   Specimen: BLOOD RIGHT HAND  Result Value Ref Range Status   Specimen Description   Final    BLOOD RIGHT HAND Performed at [facility], [address], [place], [place] [address]    Special Requests   Final    BOTTLES DRAWN AEROBIC AND ANAEROBIC Blood Culture adequate volume Performed at [facility], [address], [place], [place] [address]    Culture   Final    NO GROWTH 2 DAYS Performed at [facility], [address] [place], [place] [address]    Report Status PENDING  Incomplete     Labs: BNP (last 3 results) No results for input(s): BNP in the last 8760 hours. Basic Metabolic Panel: Recent Labs  Lab 03/16/20 1526 03/17/20 1618 03/18/20 0442  NA 136 141 141  K 3.2* 3.3* 3.7  CL 101 103 102  CO2 25 24 28   GLUCOSE 111* 146* 100*  BUN 5* 7 5*  CREATININE 0.79 0.71 0.58  CALCIUM 8.9 8.7* 8.6*   Liver Function  Tests: Recent Labs  Lab 03/16/20 1526 03/17/20 1618  AST 23 43*  ALT 20 34  ALKPHOS 80 97  BILITOT 0.7 0.7  PROT 7.1 7.1  ALBUMIN 3.4* 3.3*   Recent Labs  Lab 03/16/20 1526  LIPASE 21   No results  for input(s): AMMONIA in the last 168 hours. CBC: Recent Labs  Lab 03/16/20 1526 03/17/20 1618 03/18/20 0442  WBC 13.5* 16.9* 10.7*  NEUTROABS  --  14.9*  --   HGB 13.3 12.9 11.8*  HCT 41.7 38.5 37.1  MCV 88.7 85.9 89.8  PLT 287 293 249   Cardiac Enzymes: No results for input(s): CKTOTAL, CKMB, CKMBINDEX, TROPONINI in the last 168 hours. BNP: Invalid input(s): POCBNP CBG: No results for input(s): GLUCAP in the last 168 hours. D-Dimer No results for input(s): DDIMER in the last 72 hours. Hgb A1c No results for input(s): HGBA1C in the last 72 hours. Lipid Profile No results for input(s): CHOL, HDL, LDLCALC, TRIG, CHOLHDL, LDLDIRECT in the last 72 hours. Thyroid function studies No results for input(s): TSH, T4TOTAL, T3FREE, THYROIDAB in the last 72 hours.  Invalid input(s): FREET3 Anemia work up No results for input(s): VITAMINB12, FOLATE, FERRITIN, TIBC, IRON, RETICCTPCT in the last 72 hours. Urinalysis    Component Value Date/Time   COLORURINE YELLOW 03/17/2020 1618   APPEARANCEUR HAZY (A) 03/17/2020 1618   LABSPEC 1.018 03/17/2020 1618   PHURINE 5.0 03/17/2020 1618   GLUCOSEU NEGATIVE 03/17/2020 1618   HGBUR LARGE (A) 03/17/2020 1618   BILIRUBINUR NEGATIVE 03/17/2020 1618   KETONESUR 20 (A) 03/17/2020 1618   PROTEINUR 100 (A) 03/17/2020 1618   UROBILINOGEN 0.2 01/29/2015 0153   NITRITE NEGATIVE 03/17/2020 1618   LEUKOCYTESUR SMALL (A) 03/17/2020 1618   Sepsis Labs Invalid input(s): PROCALCITONIN,  WBC,  LACTICIDVEN Microbiology Recent Results (from the past 240 hour(s))  Blood Culture (routine x 2)     Status: None (Preliminary result)   Collection Time: 03/17/20  4:18 PM   Specimen: BLOOD LEFT HAND  Result Value Ref Range Status   Specimen  Description   Final    BLOOD LEFT HAND Performed at [facility], [place] [address] [place], [place] [address]    Special Requests   Final    BOTTLES DRAWN AEROBIC AND ANAEROBIC Blood Culture adequate volume Performed at [place] [address] [place], [place] [address]    Culture   Final    NO GROWTH 2 DAYS Performed at [place] [address], [place], [place] [address]    Report Status PENDING  Incomplete  Urine culture     Status: Abnormal   Collection Time: 03/17/20  4:18 PM   Specimen: In/Out Cath Urine  Result Value Ref Range Status   Specimen Description   Final    IN/OUT CATH URINE Performed at [place] [address], [place], [place] [address]    Special Requests   Final    NONE Performed at [facility], [place] [address] [place], [place] [address]    Culture 60,000 COLONIES/mL ESCHERICHIA COLI (A)  Final   Report Status 03/19/2020 FINAL  Final   Organism ID, Bacteria ESCHERICHIA COLI (A)  Final      Susceptibility   Escherichia coli - MIC*    AMPICILLIN >=32 RESISTANT Resistant     CEFAZOLIN <=4 SENSITIVE Sensitive     CEFTRIAXONE <=1 SENSITIVE Sensitive     CIPROFLOXACIN <=0.25 SENSITIVE Sensitive     GENTAMICIN <=1 SENSITIVE Sensitive     IMIPENEM <=0.25 SENSITIVE Sensitive     NITROFURANTOIN <=16 SENSITIVE Sensitive     TRIMETH/SULFA >=320 RESISTANT Resistant     AMPICILLIN/SULBACTAM 8 SENSITIVE Sensitive     PIP/TAZO <=4 SENSITIVE Sensitive     *  60,000 COLONIES/mL ESCHERICHIA COLI  SARS Coronavirus 2 by RT PCR (hospital order, performed in [facility] hospital lab) Nasopharyngeal Nasopharyngeal Swab     Status: None   Collection Time: 03/17/20  4:20 PM   Specimen: Nasopharyngeal Swab  Result Value Ref Range Status   SARS Coronavirus 2 NEGATIVE NEGATIVE Final    Comment: (NOTE) SARS-CoV-2 target nucleic acids are NOT DETECTED.  The SARS-CoV-2 RNA is  generally detectable in upper and lower respiratory specimens during the acute phase of infection. The lowest concentration of SARS-CoV-2 viral copies this assay can detect is 250 copies / mL. A negative result does not preclude SARS-CoV-2 infection and should not be used as the sole basis for treatment or other patient management decisions.  A negative result may occur with improper specimen collection / handling, submission of specimen other than nasopharyngeal swab, presence of viral mutation(s) within the areas targeted by this assay, and inadequate number of viral copies (<250 copies / mL). A negative result must be combined with clinical observations, patient history, and epidemiological information.  Fact Sheet for Patients:   BoilerBrush.com.cyhttps://www.fda.gov/media/136312/download  Fact Sheet for Healthcare Providers: https://pope.com/https://www.fda.gov/media/136313/download  This test is not yet approved or  cleared by the [place] FDA and has been authorized for detection and/or diagnosis of SARS-CoV-2 by FDA under an Emergency Use Authorization (EUA).  This EUA will remain in effect (meaning this test can be used) for the duration of the COVID-19 declaration under Section 564(b)(1) of the Act, 21 U.S.C. section 360bbb-3(b)(1), unless the authorization is terminated or revoked sooner.  Performed at [facility], [address] [place], [place] [address]   Blood Culture (routine x 2)     Status: None (Preliminary result)   Collection Time: 03/17/20  4:23 PM   Specimen: BLOOD RIGHT HAND  Result Value Ref Range Status   Specimen Description   Final    BLOOD RIGHT HAND Performed at [facility], [address] [place], [place] [address]    Special Requests   Final    BOTTLES DRAWN AEROBIC AND ANAEROBIC Blood Culture adequate volume Performed at [facility], [address] [place], [place] [address]    Culture   Final    NO GROWTH 2  DAYS Performed at [facility], [address] [place], [place] [address]    Report Status PENDING  Incomplete     Time coordinating discharge: 35 minutes  SIGNED:   Jacquelin Hawkingalph Jimmy Stipes, MD [facility] 03/19/2020, 12:47 PM

## 2020-03-19 NOTE — Progress Notes (Signed)
Pt alert and oriented, tolerating diet. No complaints of pain.  D/C instructions given, pt d/cd to home.

## 2020-03-22 LAB — CULTURE, BLOOD (ROUTINE X 2)
Culture: NO GROWTH
Culture: NO GROWTH
Special Requests: ADEQUATE
Special Requests: ADEQUATE

## 2020-04-04 DIAGNOSIS — Z419 Encounter for procedure for purposes other than remedying health state, unspecified: Secondary | ICD-10-CM | POA: Diagnosis not present

## 2020-05-05 DIAGNOSIS — Z419 Encounter for procedure for purposes other than remedying health state, unspecified: Secondary | ICD-10-CM | POA: Diagnosis not present

## 2020-05-23 DIAGNOSIS — K648 Other hemorrhoids: Secondary | ICD-10-CM | POA: Diagnosis not present

## 2020-05-23 DIAGNOSIS — J4520 Mild intermittent asthma, uncomplicated: Secondary | ICD-10-CM | POA: Diagnosis not present

## 2020-05-23 DIAGNOSIS — E559 Vitamin D deficiency, unspecified: Secondary | ICD-10-CM | POA: Diagnosis not present

## 2020-05-23 DIAGNOSIS — Z3042 Encounter for surveillance of injectable contraceptive: Secondary | ICD-10-CM | POA: Diagnosis not present

## 2020-05-23 DIAGNOSIS — R7303 Prediabetes: Secondary | ICD-10-CM | POA: Diagnosis not present

## 2020-05-23 DIAGNOSIS — G44229 Chronic tension-type headache, not intractable: Secondary | ICD-10-CM | POA: Diagnosis not present

## 2020-06-05 DIAGNOSIS — Z419 Encounter for procedure for purposes other than remedying health state, unspecified: Secondary | ICD-10-CM | POA: Diagnosis not present

## 2020-07-05 DIAGNOSIS — Z419 Encounter for procedure for purposes other than remedying health state, unspecified: Secondary | ICD-10-CM | POA: Diagnosis not present

## 2020-08-05 DIAGNOSIS — Z419 Encounter for procedure for purposes other than remedying health state, unspecified: Secondary | ICD-10-CM | POA: Diagnosis not present

## 2020-09-04 DIAGNOSIS — Z419 Encounter for procedure for purposes other than remedying health state, unspecified: Secondary | ICD-10-CM | POA: Diagnosis not present

## 2020-10-05 DIAGNOSIS — Z419 Encounter for procedure for purposes other than remedying health state, unspecified: Secondary | ICD-10-CM | POA: Diagnosis not present

## 2020-11-05 DIAGNOSIS — Z419 Encounter for procedure for purposes other than remedying health state, unspecified: Secondary | ICD-10-CM | POA: Diagnosis not present

## 2020-12-03 DIAGNOSIS — Z419 Encounter for procedure for purposes other than remedying health state, unspecified: Secondary | ICD-10-CM | POA: Diagnosis not present

## 2021-01-03 DIAGNOSIS — Z419 Encounter for procedure for purposes other than remedying health state, unspecified: Secondary | ICD-10-CM | POA: Diagnosis not present

## 2021-01-12 IMAGING — CT CT ABD-PELV W/ CM
2 of 4 series · 14 of 46 positions shown, 16 images · IV contrast (OMNIPAQUE 300)
Comparison: None.

CLINICAL DATA: Abdominal pain for 5 days.  Fever.

EXAM:
CT ABDOMEN AND PELVIS WITH CONTRAST
TECHNIQUE: Multidetector CT imaging of the abdomen and pelvis was performed
using the standard protocol following bolus administration of
intravenous contrast.
CONTRAST:  100mL OMNIPAQUE IOHEXOL 300 MG/ML  SOLN

[Series 2: axial st · axial · 0.58mm/px · z∈[-410,-35]mm · 11 of 86 slices shown, 13 images]
[im 6/86  soft-tissue]
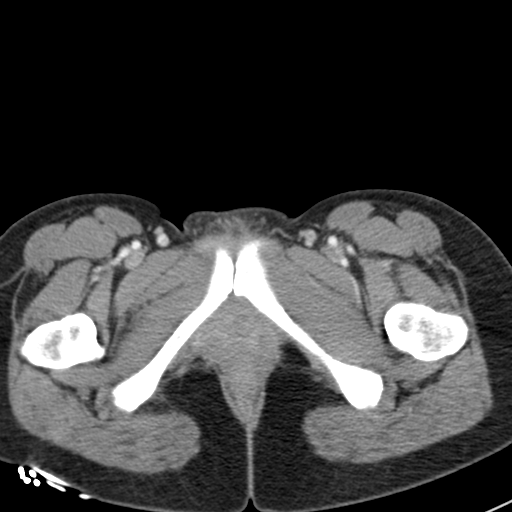
[im 6/86  bone]
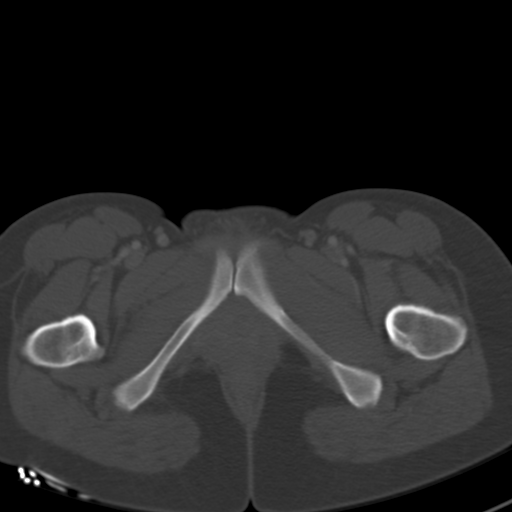
[im 16/86  soft-tissue]
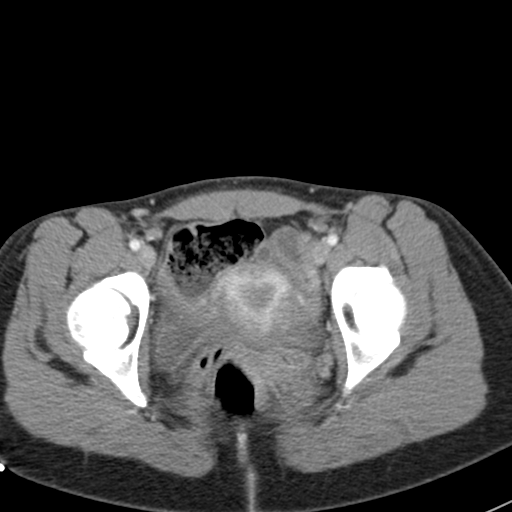
[im 21/86  soft-tissue]
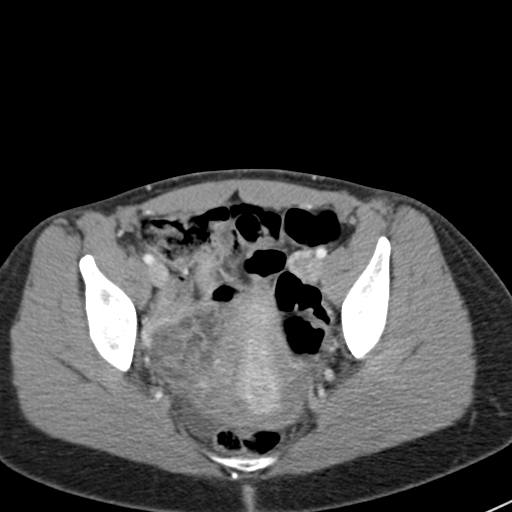
[im 31/86  soft-tissue]
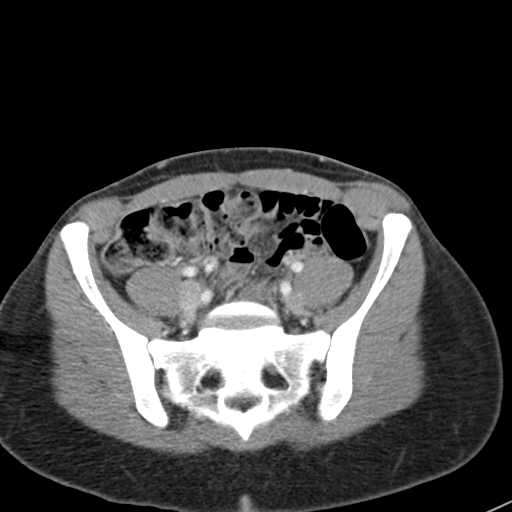
[im 36/86  soft-tissue]
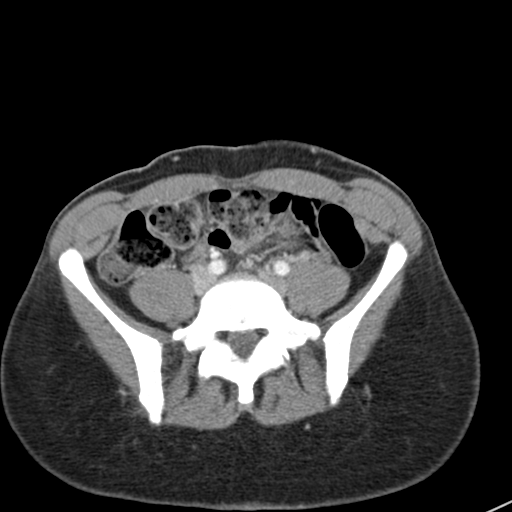
[im 46/86  soft-tissue]
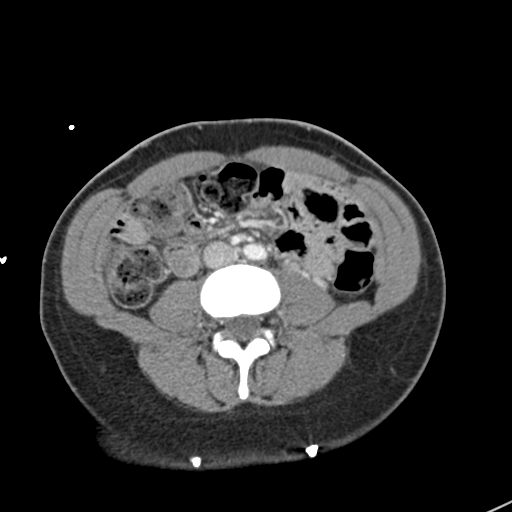
[im 51/86  soft-tissue]
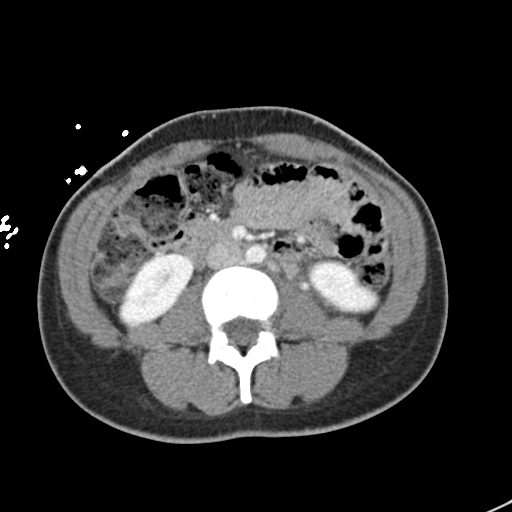
[im 56/86  soft-tissue]
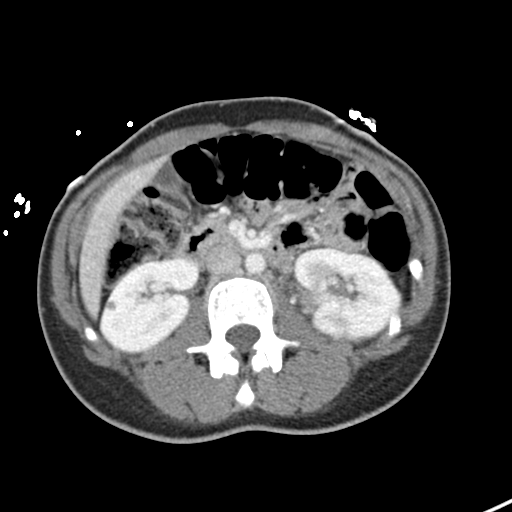
[im 66/86  soft-tissue]
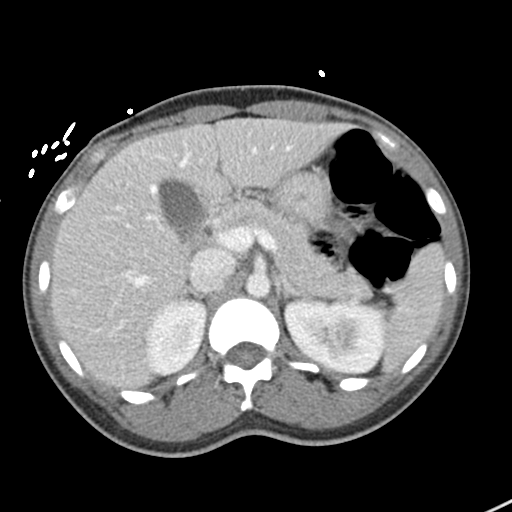
[im 66/86  bone]
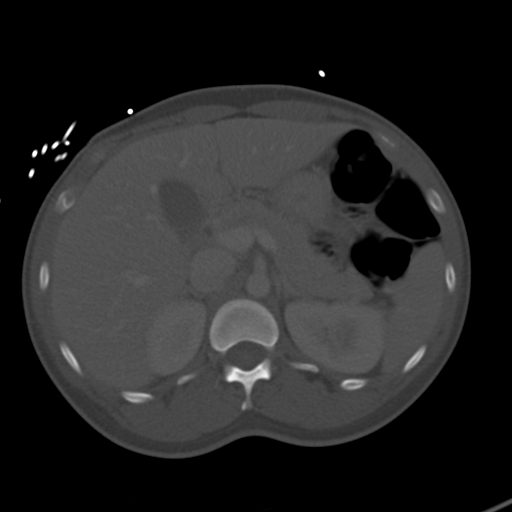
[im 71/86  soft-tissue]
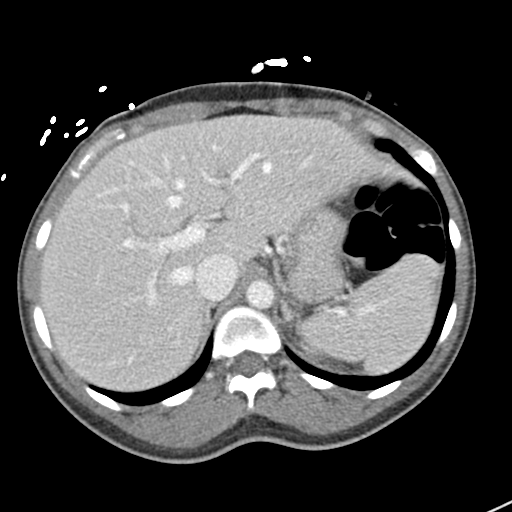
[im 81/86  soft-tissue]
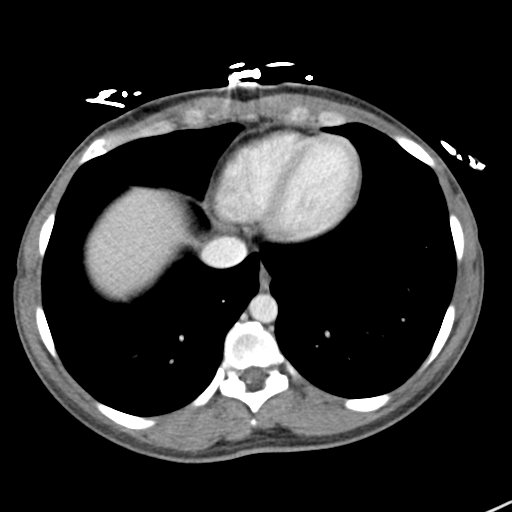

[Series 5: coronal st · coronal · 0.50mm/px · 3 of 112 slices shown]
[im 38/112  soft-tissue]
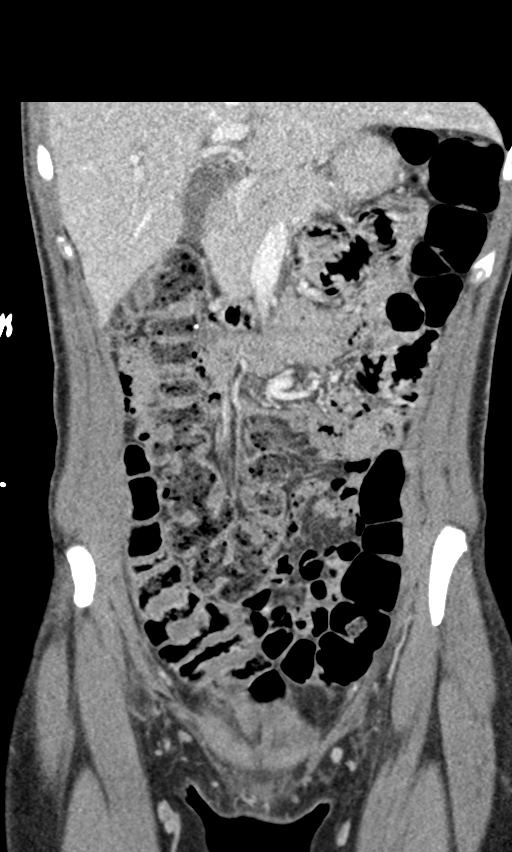
[im 50/112  soft-tissue]
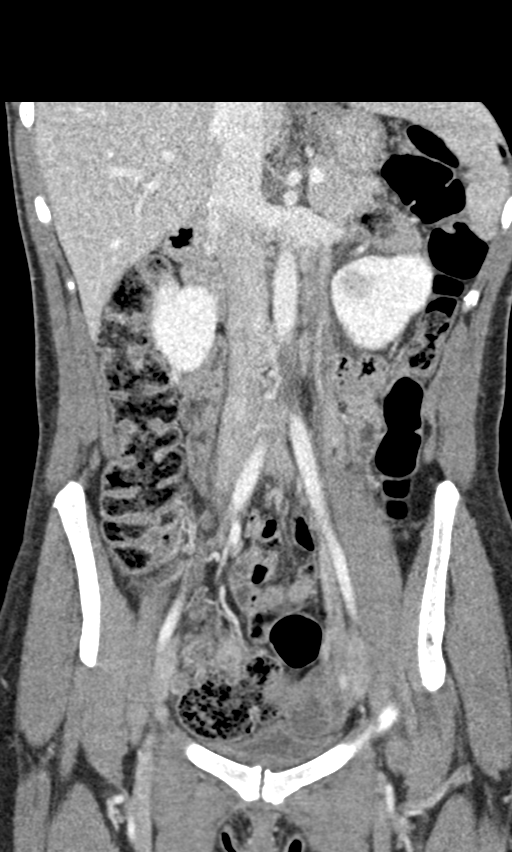
[im 62/112  soft-tissue]
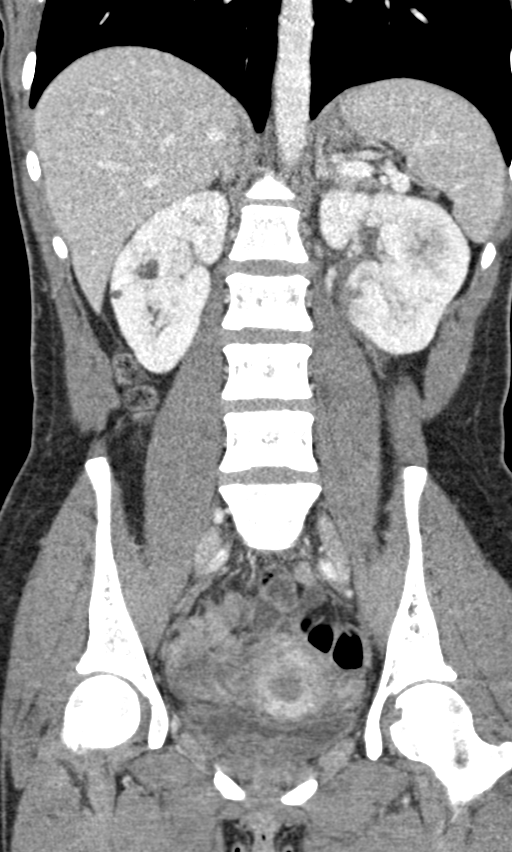

[14 of 46 positions shown; findings below may reference images not displayed]

FINDINGS: Lower chest: Lung bases are clear.

Hepatobiliary: No focal liver abnormality is seen. No gallstones,
gallbladder wall thickening, or biliary dilatation.

Pancreas: No ductal dilatation or inflammation.

Spleen: Normal in size without focal abnormality.

Adrenals/Urinary Tract: Normal adrenal glands. There is
heterogeneous enhancement of the left kidney with mild left ureteral
enhancement and thickening. Mild left perinephric edema. No renal or
ureteral calculi. No evidence of focal fluid collection. 1 Urinary
bladder is minimally distended and not well assessed. Homogeneous
enhancement of the right kidney. Subcentimeter low-density in the
periphery of the right kidney is likely cyst.

Stomach/Bowel: Bowel evaluation is limited in the absence of enteric
contrast and paucity of intra-abdominal fat. Stomach is
nondistended. There is no evidence of small-bowel obstruction. No
obvious small bowel inflammation. Normal appendix, for example
series 5, image 59. Cecum is located in the deep mid pelvis.
Moderate volume of stool in the ascending and transverse colon.
There is transverse and sigmoid colonic redundancy. No colonic wall
thickening or inflammation.

Vascular/Lymphatic: Normal caliber abdominal aorta. The portal vein
is patent. There are few prominent retroperitoneal nodes are likely
reactive.

Reproductive: The uterus is anteverted. Small physiologic cysts in
the left ovary. Right ovary is not definitively visualized. Cystic
structures appear to represent fluid within small bowel rather than
adnexal lesion.

Other: Small amount of free fluid in the dependent pelvis. No free
air. No focal fluid collection.

Musculoskeletal: There are no acute or suspicious osseous
abnormalities.
IMPRESSION: Left pyelonephritis.  No renal abscess or calculi.

## 2021-02-02 DIAGNOSIS — Z419 Encounter for procedure for purposes other than remedying health state, unspecified: Secondary | ICD-10-CM | POA: Diagnosis not present

## 2021-03-05 DIAGNOSIS — Z419 Encounter for procedure for purposes other than remedying health state, unspecified: Secondary | ICD-10-CM | POA: Diagnosis not present

## 2021-04-04 DIAGNOSIS — Z419 Encounter for procedure for purposes other than remedying health state, unspecified: Secondary | ICD-10-CM | POA: Diagnosis not present

## 2021-05-05 DIAGNOSIS — Z419 Encounter for procedure for purposes other than remedying health state, unspecified: Secondary | ICD-10-CM | POA: Diagnosis not present

## 2021-06-05 DIAGNOSIS — Z419 Encounter for procedure for purposes other than remedying health state, unspecified: Secondary | ICD-10-CM | POA: Diagnosis not present

## 2021-07-05 DIAGNOSIS — Z419 Encounter for procedure for purposes other than remedying health state, unspecified: Secondary | ICD-10-CM | POA: Diagnosis not present

## 2021-08-05 DIAGNOSIS — Z419 Encounter for procedure for purposes other than remedying health state, unspecified: Secondary | ICD-10-CM | POA: Diagnosis not present

## 2021-09-04 DIAGNOSIS — Z419 Encounter for procedure for purposes other than remedying health state, unspecified: Secondary | ICD-10-CM | POA: Diagnosis not present

## 2021-10-05 DIAGNOSIS — Z419 Encounter for procedure for purposes other than remedying health state, unspecified: Secondary | ICD-10-CM | POA: Diagnosis not present

## 2021-10-05 NOTE — L&D Delivery Note (Addendum)
Operative Delivery Note I called at 0002 for an update and pt was FD.  Upon my arrival pt was FD/0. At 1:17 AM a viable female was delivered via Vaginal, Vacuum Neurosurgeon).  Presentation: vertex; Position: Occiput,, Posterior; Station:  +1-+2 .  Vacuum delivery discussed d/t deep variables with pushing and minimal variability.  Verbal consent: obtained from patient.  Risks and benefits discussed in detail.  Risks include, but are not limited to the risks of anesthesia, bleeding, infection, damage to maternal tissues, fetal cephalhematoma.  There is also the risk of inability to effect vaginal delivery of the head, or shoulder dystocia that cannot be resolved by established maneuvers, leading to the need for emergency cesarean section.  APGAR: 9, 9; weight  pending.   Placenta status: spont, intact.   Cord:  3V with the following complications: none.  Cord pH: n/a  Anesthesia:  Epidural Instruments: Vacuum;50cmHg, No pop offs. Delivered with the 3rd push. Episiotomy: None Lacerations: None Suture Repair:  None Est. Blood Loss (mL): 108  Mom to postpartum.  Baby to Couplet care / Skin to Skin.  Delice Lesch 07/08/2022, 1:26 AM

## 2021-11-05 DIAGNOSIS — Z419 Encounter for procedure for purposes other than remedying health state, unspecified: Secondary | ICD-10-CM | POA: Diagnosis not present

## 2021-12-03 DIAGNOSIS — Z419 Encounter for procedure for purposes other than remedying health state, unspecified: Secondary | ICD-10-CM | POA: Diagnosis not present

## 2021-12-10 DIAGNOSIS — Z0001 Encounter for general adult medical examination with abnormal findings: Secondary | ICD-10-CM | POA: Diagnosis not present

## 2021-12-10 DIAGNOSIS — R7303 Prediabetes: Secondary | ICD-10-CM | POA: Diagnosis not present

## 2021-12-10 DIAGNOSIS — E559 Vitamin D deficiency, unspecified: Secondary | ICD-10-CM | POA: Diagnosis not present

## 2021-12-10 DIAGNOSIS — J4520 Mild intermittent asthma, uncomplicated: Secondary | ICD-10-CM | POA: Diagnosis not present

## 2021-12-10 DIAGNOSIS — N912 Amenorrhea, unspecified: Secondary | ICD-10-CM | POA: Diagnosis not present

## 2021-12-10 DIAGNOSIS — Z136 Encounter for screening for cardiovascular disorders: Secondary | ICD-10-CM | POA: Diagnosis not present

## 2021-12-10 DIAGNOSIS — Z131 Encounter for screening for diabetes mellitus: Secondary | ICD-10-CM | POA: Diagnosis not present

## 2021-12-10 DIAGNOSIS — Z1329 Encounter for screening for other suspected endocrine disorder: Secondary | ICD-10-CM | POA: Diagnosis not present

## 2021-12-10 DIAGNOSIS — Z3201 Encounter for pregnancy test, result positive: Secondary | ICD-10-CM | POA: Diagnosis not present

## 2021-12-25 DIAGNOSIS — J4520 Mild intermittent asthma, uncomplicated: Secondary | ICD-10-CM | POA: Diagnosis not present

## 2022-01-03 DIAGNOSIS — Z419 Encounter for procedure for purposes other than remedying health state, unspecified: Secondary | ICD-10-CM | POA: Diagnosis not present

## 2022-01-27 DIAGNOSIS — Z3A16 16 weeks gestation of pregnancy: Secondary | ICD-10-CM | POA: Diagnosis not present

## 2022-01-27 DIAGNOSIS — O99213 Obesity complicating pregnancy, third trimester: Secondary | ICD-10-CM | POA: Diagnosis not present

## 2022-01-27 DIAGNOSIS — N912 Amenorrhea, unspecified: Secondary | ICD-10-CM | POA: Diagnosis not present

## 2022-01-27 DIAGNOSIS — Z1371 Encounter for nonprocreative screening for genetic disease carrier status: Secondary | ICD-10-CM | POA: Diagnosis not present

## 2022-01-27 DIAGNOSIS — Z113 Encounter for screening for infections with a predominantly sexual mode of transmission: Secondary | ICD-10-CM | POA: Diagnosis not present

## 2022-01-27 DIAGNOSIS — Z369 Encounter for antenatal screening, unspecified: Secondary | ICD-10-CM | POA: Diagnosis not present

## 2022-01-27 DIAGNOSIS — Z331 Pregnant state, incidental: Secondary | ICD-10-CM | POA: Diagnosis not present

## 2022-01-27 DIAGNOSIS — Z315 Encounter for genetic counseling: Secondary | ICD-10-CM | POA: Diagnosis not present

## 2022-01-27 LAB — OB RESULTS CONSOLE RPR: RPR: NONREACTIVE

## 2022-01-27 LAB — OB RESULTS CONSOLE RUBELLA ANTIBODY, IGM: Rubella: IMMUNE

## 2022-01-27 LAB — OB RESULTS CONSOLE ABO/RH: RH Type: POSITIVE

## 2022-01-27 LAB — OB RESULTS CONSOLE ANTIBODY SCREEN: Antibody Screen: NEGATIVE

## 2022-01-27 LAB — OB RESULTS CONSOLE HEPATITIS B SURFACE ANTIGEN: Hepatitis B Surface Ag: NEGATIVE

## 2022-01-27 LAB — HEPATITIS C ANTIBODY: HCV Ab: NEGATIVE

## 2022-01-27 LAB — OB RESULTS CONSOLE HIV ANTIBODY (ROUTINE TESTING): HIV: NONREACTIVE

## 2022-01-30 LAB — OB RESULTS CONSOLE GC/CHLAMYDIA
Chlamydia: NEGATIVE
Neisseria Gonorrhea: NEGATIVE

## 2022-02-02 DIAGNOSIS — Z419 Encounter for procedure for purposes other than remedying health state, unspecified: Secondary | ICD-10-CM | POA: Diagnosis not present

## 2022-02-27 DIAGNOSIS — Z363 Encounter for antenatal screening for malformations: Secondary | ICD-10-CM | POA: Diagnosis not present

## 2022-02-27 DIAGNOSIS — Z1371 Encounter for nonprocreative screening for genetic disease carrier status: Secondary | ICD-10-CM | POA: Diagnosis not present

## 2022-02-27 DIAGNOSIS — Z3A20 20 weeks gestation of pregnancy: Secondary | ICD-10-CM | POA: Diagnosis not present

## 2022-03-05 ENCOUNTER — Other Ambulatory Visit: Payer: Self-pay | Admitting: Obstetrics and Gynecology

## 2022-03-05 DIAGNOSIS — Z419 Encounter for procedure for purposes other than remedying health state, unspecified: Secondary | ICD-10-CM | POA: Diagnosis not present

## 2022-03-05 DIAGNOSIS — Z363 Encounter for antenatal screening for malformations: Secondary | ICD-10-CM

## 2022-03-12 ENCOUNTER — Encounter: Payer: Self-pay | Admitting: *Deleted

## 2022-03-17 ENCOUNTER — Ambulatory Visit: Payer: Medicaid Other | Attending: Obstetrics and Gynecology

## 2022-03-17 ENCOUNTER — Ambulatory Visit: Payer: Medicaid Other | Admitting: *Deleted

## 2022-03-17 ENCOUNTER — Other Ambulatory Visit: Payer: Self-pay | Admitting: *Deleted

## 2022-03-17 VITALS — BP 103/65 | HR 69

## 2022-03-17 DIAGNOSIS — O09522 Supervision of elderly multigravida, second trimester: Secondary | ICD-10-CM

## 2022-03-17 DIAGNOSIS — O0932 Supervision of pregnancy with insufficient antenatal care, second trimester: Secondary | ICD-10-CM

## 2022-03-17 DIAGNOSIS — Z363 Encounter for antenatal screening for malformations: Secondary | ICD-10-CM | POA: Diagnosis not present

## 2022-03-17 DIAGNOSIS — O09892 Supervision of other high risk pregnancies, second trimester: Secondary | ICD-10-CM

## 2022-03-17 DIAGNOSIS — O283 Abnormal ultrasonic finding on antenatal screening of mother: Secondary | ICD-10-CM

## 2022-03-17 DIAGNOSIS — O09292 Supervision of pregnancy with other poor reproductive or obstetric history, second trimester: Secondary | ICD-10-CM

## 2022-03-25 ENCOUNTER — Telehealth: Payer: Self-pay | Admitting: *Deleted

## 2022-04-04 DIAGNOSIS — Z419 Encounter for procedure for purposes other than remedying health state, unspecified: Secondary | ICD-10-CM | POA: Diagnosis not present

## 2022-04-06 DIAGNOSIS — Z3A25 25 weeks gestation of pregnancy: Secondary | ICD-10-CM | POA: Diagnosis not present

## 2022-04-06 DIAGNOSIS — O359XX0 Maternal care for (suspected) fetal abnormality and damage, unspecified, not applicable or unspecified: Secondary | ICD-10-CM | POA: Diagnosis not present

## 2022-04-15 ENCOUNTER — Ambulatory Visit: Payer: Medicaid Other | Attending: Maternal & Fetal Medicine

## 2022-04-15 ENCOUNTER — Other Ambulatory Visit: Payer: Self-pay | Admitting: Maternal & Fetal Medicine

## 2022-04-15 ENCOUNTER — Ambulatory Visit: Payer: Medicaid Other | Admitting: *Deleted

## 2022-04-15 VITALS — BP 116/72 | HR 74

## 2022-04-15 DIAGNOSIS — O09292 Supervision of pregnancy with other poor reproductive or obstetric history, second trimester: Secondary | ICD-10-CM | POA: Diagnosis not present

## 2022-04-15 DIAGNOSIS — O0932 Supervision of pregnancy with insufficient antenatal care, second trimester: Secondary | ICD-10-CM | POA: Diagnosis not present

## 2022-04-15 DIAGNOSIS — Z141 Cystic fibrosis carrier: Secondary | ICD-10-CM | POA: Diagnosis not present

## 2022-04-15 DIAGNOSIS — O283 Abnormal ultrasonic finding on antenatal screening of mother: Secondary | ICD-10-CM

## 2022-04-15 DIAGNOSIS — O09892 Supervision of other high risk pregnancies, second trimester: Secondary | ICD-10-CM | POA: Diagnosis not present

## 2022-04-15 DIAGNOSIS — Z363 Encounter for antenatal screening for malformations: Secondary | ICD-10-CM

## 2022-04-15 DIAGNOSIS — O09522 Supervision of elderly multigravida, second trimester: Secondary | ICD-10-CM

## 2022-04-15 DIAGNOSIS — O99332 Smoking (tobacco) complicating pregnancy, second trimester: Secondary | ICD-10-CM | POA: Diagnosis not present

## 2022-04-15 DIAGNOSIS — Z3A27 27 weeks gestation of pregnancy: Secondary | ICD-10-CM

## 2022-04-16 ENCOUNTER — Other Ambulatory Visit: Payer: Self-pay | Admitting: *Deleted

## 2022-04-16 DIAGNOSIS — O283 Abnormal ultrasonic finding on antenatal screening of mother: Secondary | ICD-10-CM

## 2022-04-16 DIAGNOSIS — O99332 Smoking (tobacco) complicating pregnancy, second trimester: Secondary | ICD-10-CM

## 2022-04-16 DIAGNOSIS — O09522 Supervision of elderly multigravida, second trimester: Secondary | ICD-10-CM

## 2022-04-16 DIAGNOSIS — O0932 Supervision of pregnancy with insufficient antenatal care, second trimester: Secondary | ICD-10-CM

## 2022-04-16 DIAGNOSIS — O09299 Supervision of pregnancy with other poor reproductive or obstetric history, unspecified trimester: Secondary | ICD-10-CM

## 2022-04-28 DIAGNOSIS — Z362 Encounter for other antenatal screening follow-up: Secondary | ICD-10-CM | POA: Diagnosis not present

## 2022-04-29 ENCOUNTER — Ambulatory Visit: Payer: Medicaid Other

## 2022-04-29 ENCOUNTER — Ambulatory Visit: Payer: Medicaid Other | Admitting: *Deleted

## 2022-04-29 ENCOUNTER — Ambulatory Visit: Payer: Medicaid Other | Attending: Obstetrics and Gynecology

## 2022-04-29 ENCOUNTER — Encounter: Payer: Self-pay | Admitting: *Deleted

## 2022-04-29 VITALS — BP 113/64 | HR 74

## 2022-04-29 DIAGNOSIS — O09523 Supervision of elderly multigravida, third trimester: Secondary | ICD-10-CM

## 2022-04-29 DIAGNOSIS — O99332 Smoking (tobacco) complicating pregnancy, second trimester: Secondary | ICD-10-CM | POA: Insufficient documentation

## 2022-04-29 DIAGNOSIS — O0932 Supervision of pregnancy with insufficient antenatal care, second trimester: Secondary | ICD-10-CM | POA: Insufficient documentation

## 2022-04-29 DIAGNOSIS — O09522 Supervision of elderly multigravida, second trimester: Secondary | ICD-10-CM | POA: Insufficient documentation

## 2022-04-29 DIAGNOSIS — O283 Abnormal ultrasonic finding on antenatal screening of mother: Secondary | ICD-10-CM | POA: Diagnosis not present

## 2022-04-29 DIAGNOSIS — O365930 Maternal care for other known or suspected poor fetal growth, third trimester, not applicable or unspecified: Secondary | ICD-10-CM | POA: Diagnosis not present

## 2022-04-29 DIAGNOSIS — O09299 Supervision of pregnancy with other poor reproductive or obstetric history, unspecified trimester: Secondary | ICD-10-CM | POA: Diagnosis not present

## 2022-04-29 DIAGNOSIS — Z3A29 29 weeks gestation of pregnancy: Secondary | ICD-10-CM

## 2022-05-05 DIAGNOSIS — Z419 Encounter for procedure for purposes other than remedying health state, unspecified: Secondary | ICD-10-CM | POA: Diagnosis not present

## 2022-05-06 ENCOUNTER — Ambulatory Visit (HOSPITAL_BASED_OUTPATIENT_CLINIC_OR_DEPARTMENT_OTHER): Payer: Medicaid Other | Admitting: *Deleted

## 2022-05-06 ENCOUNTER — Ambulatory Visit: Payer: Medicaid Other | Attending: Obstetrics and Gynecology

## 2022-05-06 ENCOUNTER — Ambulatory Visit: Payer: Medicaid Other | Admitting: *Deleted

## 2022-05-06 VITALS — BP 109/72 | HR 73

## 2022-05-06 DIAGNOSIS — O99333 Smoking (tobacco) complicating pregnancy, third trimester: Secondary | ICD-10-CM

## 2022-05-06 DIAGNOSIS — O09522 Supervision of elderly multigravida, second trimester: Secondary | ICD-10-CM | POA: Diagnosis not present

## 2022-05-06 DIAGNOSIS — O365930 Maternal care for other known or suspected poor fetal growth, third trimester, not applicable or unspecified: Secondary | ICD-10-CM

## 2022-05-06 DIAGNOSIS — O09523 Supervision of elderly multigravida, third trimester: Secondary | ICD-10-CM

## 2022-05-06 DIAGNOSIS — O283 Abnormal ultrasonic finding on antenatal screening of mother: Secondary | ICD-10-CM | POA: Diagnosis not present

## 2022-05-06 DIAGNOSIS — O99332 Smoking (tobacco) complicating pregnancy, second trimester: Secondary | ICD-10-CM | POA: Insufficient documentation

## 2022-05-06 DIAGNOSIS — Z3A30 30 weeks gestation of pregnancy: Secondary | ICD-10-CM

## 2022-05-06 DIAGNOSIS — F17210 Nicotine dependence, cigarettes, uncomplicated: Secondary | ICD-10-CM

## 2022-05-06 DIAGNOSIS — O0933 Supervision of pregnancy with insufficient antenatal care, third trimester: Secondary | ICD-10-CM | POA: Diagnosis not present

## 2022-05-06 DIAGNOSIS — O35BXX0 Maternal care for other (suspected) fetal abnormality and damage, fetal cardiac anomalies, not applicable or unspecified: Secondary | ICD-10-CM | POA: Diagnosis not present

## 2022-05-06 DIAGNOSIS — O285 Abnormal chromosomal and genetic finding on antenatal screening of mother: Secondary | ICD-10-CM | POA: Diagnosis not present

## 2022-05-06 DIAGNOSIS — O09299 Supervision of pregnancy with other poor reproductive or obstetric history, unspecified trimester: Secondary | ICD-10-CM | POA: Insufficient documentation

## 2022-05-06 DIAGNOSIS — O0932 Supervision of pregnancy with insufficient antenatal care, second trimester: Secondary | ICD-10-CM | POA: Insufficient documentation

## 2022-05-06 DIAGNOSIS — Z141 Cystic fibrosis carrier: Secondary | ICD-10-CM

## 2022-05-06 DIAGNOSIS — O09293 Supervision of pregnancy with other poor reproductive or obstetric history, third trimester: Secondary | ICD-10-CM

## 2022-05-06 NOTE — Procedures (Signed)
Rachel Horn [birth year] [redacted]w[redacted]d  Fetus A Non-Stress Test Interpretation for 05/06/22  Indication: IUGR  Fetal Heart Rate A Mode: External Baseline Rate (A): 140 bpm Variability: Minimal, Moderate Accelerations: 10 x 10 Decelerations: None Multiple birth?: No  Uterine Activity Mode: Palpation, Toco Contraction Frequency (min): none Resting Tone Palpated: Relaxed  Interpretation (Fetal Testing) Nonstress Test Interpretation: Reactive Overall Impression: Reassuring for gestational age Comments: Dr. Judeth Cornfield reviewed tracing

## 2022-05-07 ENCOUNTER — Other Ambulatory Visit: Payer: Self-pay | Admitting: *Deleted

## 2022-05-07 DIAGNOSIS — O365990 Maternal care for other known or suspected poor fetal growth, unspecified trimester, not applicable or unspecified: Secondary | ICD-10-CM

## 2022-05-12 DIAGNOSIS — J45909 Unspecified asthma, uncomplicated: Secondary | ICD-10-CM | POA: Diagnosis not present

## 2022-05-12 DIAGNOSIS — O365999 Maternal care for other known or suspected poor fetal growth, unspecified trimester, other fetus: Secondary | ICD-10-CM | POA: Diagnosis not present

## 2022-05-12 DIAGNOSIS — E559 Vitamin D deficiency, unspecified: Secondary | ICD-10-CM | POA: Diagnosis not present

## 2022-05-12 DIAGNOSIS — R8271 Bacteriuria: Secondary | ICD-10-CM | POA: Diagnosis not present

## 2022-05-12 DIAGNOSIS — Z8759 Personal history of other complications of pregnancy, childbirth and the puerperium: Secondary | ICD-10-CM | POA: Diagnosis not present

## 2022-05-12 DIAGNOSIS — O09522 Supervision of elderly multigravida, second trimester: Secondary | ICD-10-CM | POA: Diagnosis not present

## 2022-05-12 DIAGNOSIS — Z91040 Latex allergy status: Secondary | ICD-10-CM | POA: Diagnosis not present

## 2022-05-12 DIAGNOSIS — R895 Abnormal microbiological findings in specimens from other organs, systems and tissues: Secondary | ICD-10-CM | POA: Diagnosis not present

## 2022-05-12 DIAGNOSIS — O0933 Supervision of pregnancy with insufficient antenatal care, third trimester: Secondary | ICD-10-CM | POA: Diagnosis not present

## 2022-05-14 ENCOUNTER — Ambulatory Visit: Payer: Medicaid Other

## 2022-05-15 ENCOUNTER — Encounter: Payer: Self-pay | Admitting: *Deleted

## 2022-05-15 ENCOUNTER — Ambulatory Visit (HOSPITAL_BASED_OUTPATIENT_CLINIC_OR_DEPARTMENT_OTHER): Payer: Medicaid Other | Admitting: *Deleted

## 2022-05-15 ENCOUNTER — Ambulatory Visit: Payer: Medicaid Other | Admitting: *Deleted

## 2022-05-15 ENCOUNTER — Ambulatory Visit: Payer: Medicaid Other | Attending: Obstetrics and Gynecology

## 2022-05-15 ENCOUNTER — Other Ambulatory Visit: Payer: Self-pay | Admitting: Obstetrics and Gynecology

## 2022-05-15 VITALS — BP 109/63 | HR 76

## 2022-05-15 DIAGNOSIS — Z141 Cystic fibrosis carrier: Secondary | ICD-10-CM

## 2022-05-15 DIAGNOSIS — O365930 Maternal care for other known or suspected poor fetal growth, third trimester, not applicable or unspecified: Secondary | ICD-10-CM

## 2022-05-15 DIAGNOSIS — O35BXX0 Maternal care for other (suspected) fetal abnormality and damage, fetal cardiac anomalies, not applicable or unspecified: Secondary | ICD-10-CM

## 2022-05-15 DIAGNOSIS — O99333 Smoking (tobacco) complicating pregnancy, third trimester: Secondary | ICD-10-CM

## 2022-05-15 DIAGNOSIS — Z3A31 31 weeks gestation of pregnancy: Secondary | ICD-10-CM | POA: Diagnosis not present

## 2022-05-15 DIAGNOSIS — O365990 Maternal care for other known or suspected poor fetal growth, unspecified trimester, not applicable or unspecified: Secondary | ICD-10-CM | POA: Insufficient documentation

## 2022-05-15 DIAGNOSIS — O09293 Supervision of pregnancy with other poor reproductive or obstetric history, third trimester: Secondary | ICD-10-CM

## 2022-05-15 DIAGNOSIS — O0933 Supervision of pregnancy with insufficient antenatal care, third trimester: Secondary | ICD-10-CM | POA: Diagnosis not present

## 2022-05-15 DIAGNOSIS — O285 Abnormal chromosomal and genetic finding on antenatal screening of mother: Secondary | ICD-10-CM | POA: Diagnosis not present

## 2022-05-15 DIAGNOSIS — F17210 Nicotine dependence, cigarettes, uncomplicated: Secondary | ICD-10-CM | POA: Diagnosis not present

## 2022-05-15 DIAGNOSIS — O09523 Supervision of elderly multigravida, third trimester: Secondary | ICD-10-CM | POA: Diagnosis not present

## 2022-05-15 NOTE — Procedures (Signed)
Rachel Horn 1981/01/29 [redacted]w[redacted]d  Fetus A Non-Stress Test Interpretation for 05/15/22  Indication: IUGR  Fetal Heart Rate A Mode: External Baseline Rate (A): 130 bpm Variability: Moderate Accelerations: 10 x 10 Decelerations: None Multiple birth?: No  Uterine Activity Mode: Palpation, Toco Contraction Frequency (min): none Resting Tone Palpated: Relaxed  Interpretation (Fetal Testing) Nonstress Test Interpretation: Reactive Overall Impression: Reassuring for gestational age Comments: Dr. Grace Bushy reviewed tracing

## 2022-05-22 ENCOUNTER — Ambulatory Visit: Payer: Medicaid Other

## 2022-05-22 ENCOUNTER — Ambulatory Visit: Payer: Medicaid Other | Attending: Obstetrics and Gynecology

## 2022-05-28 ENCOUNTER — Ambulatory Visit: Payer: Medicaid Other

## 2022-05-28 ENCOUNTER — Ambulatory Visit: Payer: Medicaid Other | Attending: Obstetrics and Gynecology

## 2022-06-04 ENCOUNTER — Other Ambulatory Visit: Payer: Self-pay | Admitting: *Deleted

## 2022-06-04 ENCOUNTER — Ambulatory Visit: Payer: Medicaid Other

## 2022-06-04 ENCOUNTER — Other Ambulatory Visit: Payer: Self-pay | Admitting: Obstetrics and Gynecology

## 2022-06-04 ENCOUNTER — Other Ambulatory Visit: Payer: Self-pay | Admitting: Obstetrics

## 2022-06-04 ENCOUNTER — Ambulatory Visit: Payer: Medicaid Other | Admitting: *Deleted

## 2022-06-04 ENCOUNTER — Ambulatory Visit: Payer: Medicaid Other | Attending: Obstetrics and Gynecology

## 2022-06-04 ENCOUNTER — Encounter: Payer: Self-pay | Admitting: *Deleted

## 2022-06-04 VITALS — BP 106/61 | HR 80

## 2022-06-04 DIAGNOSIS — Z3A34 34 weeks gestation of pregnancy: Secondary | ICD-10-CM | POA: Diagnosis not present

## 2022-06-04 DIAGNOSIS — O365930 Maternal care for other known or suspected poor fetal growth, third trimester, not applicable or unspecified: Secondary | ICD-10-CM

## 2022-06-04 DIAGNOSIS — O285 Abnormal chromosomal and genetic finding on antenatal screening of mother: Secondary | ICD-10-CM

## 2022-06-04 DIAGNOSIS — O99333 Smoking (tobacco) complicating pregnancy, third trimester: Secondary | ICD-10-CM

## 2022-06-04 DIAGNOSIS — O09293 Supervision of pregnancy with other poor reproductive or obstetric history, third trimester: Secondary | ICD-10-CM | POA: Diagnosis not present

## 2022-06-04 DIAGNOSIS — Z141 Cystic fibrosis carrier: Secondary | ICD-10-CM | POA: Diagnosis not present

## 2022-06-04 DIAGNOSIS — O09523 Supervision of elderly multigravida, third trimester: Secondary | ICD-10-CM | POA: Diagnosis not present

## 2022-06-04 DIAGNOSIS — O35BXX0 Maternal care for other (suspected) fetal abnormality and damage, fetal cardiac anomalies, not applicable or unspecified: Secondary | ICD-10-CM

## 2022-06-04 DIAGNOSIS — O0933 Supervision of pregnancy with insufficient antenatal care, third trimester: Secondary | ICD-10-CM | POA: Diagnosis not present

## 2022-06-04 DIAGNOSIS — O365990 Maternal care for other known or suspected poor fetal growth, unspecified trimester, not applicable or unspecified: Secondary | ICD-10-CM | POA: Insufficient documentation

## 2022-06-04 DIAGNOSIS — F17210 Nicotine dependence, cigarettes, uncomplicated: Secondary | ICD-10-CM

## 2022-06-05 DIAGNOSIS — Z419 Encounter for procedure for purposes other than remedying health state, unspecified: Secondary | ICD-10-CM | POA: Diagnosis not present

## 2022-06-12 ENCOUNTER — Ambulatory Visit: Payer: Medicaid Other | Attending: Obstetrics

## 2022-06-12 ENCOUNTER — Ambulatory Visit: Payer: Medicaid Other

## 2022-06-13 ENCOUNTER — Encounter (HOSPITAL_COMMUNITY): Payer: Self-pay | Admitting: Obstetrics and Gynecology

## 2022-06-13 ENCOUNTER — Inpatient Hospital Stay (HOSPITAL_COMMUNITY)
Admission: AD | Admit: 2022-06-13 | Discharge: 2022-06-13 | Disposition: A | Discharge status: Home / Self Care | Payer: Medicaid Other | Attending: Obstetrics and Gynecology | Admitting: Obstetrics and Gynecology

## 2022-06-13 DIAGNOSIS — Z0371 Encounter for suspected problem with amniotic cavity and membrane ruled out: Secondary | ICD-10-CM

## 2022-06-13 DIAGNOSIS — O26893 Other specified pregnancy related conditions, third trimester: Secondary | ICD-10-CM | POA: Diagnosis present

## 2022-06-13 DIAGNOSIS — Z3689 Encounter for other specified antenatal screening: Secondary | ICD-10-CM

## 2022-06-13 DIAGNOSIS — Z3A35 35 weeks gestation of pregnancy: Secondary | ICD-10-CM | POA: Insufficient documentation

## 2022-06-13 DIAGNOSIS — O2243 Hemorrhoids in pregnancy, third trimester: Secondary | ICD-10-CM | POA: Diagnosis not present

## 2022-06-13 LAB — WET PREP, GENITAL
Clue Cells Wet Prep HPF POC: NONE SEEN
Sperm: NONE SEEN
Trich, Wet Prep: NONE SEEN
WBC, Wet Prep HPF POC: <10 (ref <10)
Yeast Wet Prep HPF POC: NONE SEEN

## 2022-06-13 LAB — POCT FERN TEST

## 2022-06-13 MED ORDER — WITCH HAZEL-GLYCERIN EX PADS: 1.0000 | MEDICATED_PAD | CUTANEOUS | 12 refills | Status: AC | PRN

## 2022-06-13 MED ORDER — PROCTOFOAM HC 1-1 % EX FOAM: 1.0000 | Freq: Two times a day (BID) | CUTANEOUS | 1 refills | Status: AC

## 2022-06-13 NOTE — MAU Note (Signed)
Initial fern negative-provider notified

## 2022-06-13 NOTE — MAU Note (Signed)
Pt says thinks SROM- at 240pm- while having a UC No more fluid - but still having UC's [facility]- [facility]- No VE  Had an appointment on Friday- but didn't go-BC of pain -10

## 2022-06-13 NOTE — MAU Provider Note (Signed)
S: Ms. Rachel Horn is a 41 y.o. [id number] at [redacted]w[redacted]d  who presents to MAU today complaining of leaking of fluid since 1440. She denies vaginal bleeding. She endorses contractions. She reports normal fetal movement.    O: BP 118/77 (BP Location: Right Arm)   Pulse 78   Temp 97.8 F (36.6 C) (Oral)   Resp 20   Ht 5\' 2"  (1.575 m)   Wt 63.8 kg   LMP 10/07/2021   BMI 25.73 kg/m  GENERAL: Well-developed, well-nourished female in no acute distress.  HEAD: Normocephalic, atraumatic.  CHEST: Normal effort of breathing, regular heart rate ABDOMEN: Soft, nontender, gravid PELVIC: Normal external female genitalia. Vagina is pink and rugated. Cervix with normal contour, no lesions, but friable. Normal discharge.  Negative pooling. Fern and 12/05/2021. Rectum: Multiple external hemorrhoids noted. No apparent strangulation or active bleeding noted.   Cervical exam:   Closed   Fetal Monitoring: FHT: 125 bpm, Mod Var, -Decels, +Accels Toco: Irritability  Results for orders placed or performed during the hospital encounter of 06/13/22 (from the past 24 hour(s))  Wet prep, genital     Status: None   Collection Time: 06/13/22  3:39 AM   Specimen: Cervix  Result Value Ref Range   Yeast Wet Prep HPF POC NONE SEEN NONE SEEN   Trich, Wet Prep NONE SEEN NONE SEEN   Clue Cells Wet Prep HPF POC NONE SEEN NONE SEEN   WBC, Wet Prep HPF POC <10 <10   Sperm NONE SEEN      A: SIUP at [redacted]w[redacted]d  Membranes intact Cat I FT Hemorrhoids  P: Fern Collected and negative Reassured that findings are not suggestive of SROM. Cervix closed. Wet prep collected. Reviewed management of hemorrhoids.  Await results and reassess.   [redacted]w[redacted]d, CNM 06/13/2022 2:49 AM  Reassessment (4:19 AM)  -Results as above. -Patient informed and without questions. -Rx for Proctofoam and tucks pads sent to pharmacy. -GI referral placed. -Encouraged to call primary office or return to MAU if symptoms worsen or with  the onset of new symptoms. -Discharged to home in stable condition.  08/13/2022 MSN, CNM Advanced Practice Provider, [facility]

## 2022-06-15 DIAGNOSIS — Z124 Encounter for screening for malignant neoplasm of cervix: Secondary | ICD-10-CM | POA: Diagnosis not present

## 2022-06-15 DIAGNOSIS — O09522 Supervision of elderly multigravida, second trimester: Secondary | ICD-10-CM | POA: Diagnosis not present

## 2022-06-15 DIAGNOSIS — Z23 Encounter for immunization: Secondary | ICD-10-CM | POA: Diagnosis not present

## 2022-06-15 DIAGNOSIS — K649 Unspecified hemorrhoids: Secondary | ICD-10-CM | POA: Diagnosis not present

## 2022-06-15 DIAGNOSIS — Z880 Allergy status to penicillin: Secondary | ICD-10-CM | POA: Diagnosis not present

## 2022-06-15 DIAGNOSIS — J45909 Unspecified asthma, uncomplicated: Secondary | ICD-10-CM | POA: Diagnosis not present

## 2022-06-15 DIAGNOSIS — R8271 Bacteriuria: Secondary | ICD-10-CM | POA: Diagnosis not present

## 2022-06-15 DIAGNOSIS — O0933 Supervision of pregnancy with insufficient antenatal care, third trimester: Secondary | ICD-10-CM | POA: Diagnosis not present

## 2022-06-15 DIAGNOSIS — Z3A35 35 weeks gestation of pregnancy: Secondary | ICD-10-CM | POA: Diagnosis not present

## 2022-06-15 DIAGNOSIS — Z141 Cystic fibrosis carrier: Secondary | ICD-10-CM | POA: Diagnosis not present

## 2022-06-15 DIAGNOSIS — Z8759 Personal history of other complications of pregnancy, childbirth and the puerperium: Secondary | ICD-10-CM | POA: Diagnosis not present

## 2022-06-15 LAB — OB RESULTS CONSOLE GBS: GBS: NEGATIVE

## 2022-06-19 ENCOUNTER — Ambulatory Visit: Payer: Medicaid Other | Attending: Obstetrics

## 2022-06-19 ENCOUNTER — Ambulatory Visit: Payer: Medicaid Other | Admitting: *Deleted

## 2022-06-19 VITALS — BP 111/77 | HR 74

## 2022-06-19 DIAGNOSIS — Z3A36 36 weeks gestation of pregnancy: Secondary | ICD-10-CM

## 2022-06-19 DIAGNOSIS — O365930 Maternal care for other known or suspected poor fetal growth, third trimester, not applicable or unspecified: Secondary | ICD-10-CM | POA: Diagnosis not present

## 2022-06-19 DIAGNOSIS — O99332 Smoking (tobacco) complicating pregnancy, second trimester: Secondary | ICD-10-CM | POA: Diagnosis not present

## 2022-06-19 DIAGNOSIS — F17210 Nicotine dependence, cigarettes, uncomplicated: Secondary | ICD-10-CM | POA: Diagnosis not present

## 2022-06-19 DIAGNOSIS — O09523 Supervision of elderly multigravida, third trimester: Secondary | ICD-10-CM | POA: Diagnosis not present

## 2022-06-19 DIAGNOSIS — O35BXX0 Maternal care for other (suspected) fetal abnormality and damage, fetal cardiac anomalies, not applicable or unspecified: Secondary | ICD-10-CM

## 2022-06-19 DIAGNOSIS — O285 Abnormal chromosomal and genetic finding on antenatal screening of mother: Secondary | ICD-10-CM | POA: Diagnosis not present

## 2022-06-19 DIAGNOSIS — O0933 Supervision of pregnancy with insufficient antenatal care, third trimester: Secondary | ICD-10-CM | POA: Diagnosis not present

## 2022-06-19 DIAGNOSIS — Z141 Cystic fibrosis carrier: Secondary | ICD-10-CM | POA: Diagnosis not present

## 2022-06-23 ENCOUNTER — Ambulatory Visit: Payer: Medicaid Other | Admitting: Internal Medicine

## 2022-06-25 ENCOUNTER — Ambulatory Visit: Payer: Medicaid Other

## 2022-06-29 ENCOUNTER — Ambulatory Visit: Payer: Medicaid Other | Attending: Obstetrics

## 2022-06-29 ENCOUNTER — Ambulatory Visit: Payer: Medicaid Other | Admitting: *Deleted

## 2022-06-29 VITALS — BP 117/74 | HR 80

## 2022-06-29 DIAGNOSIS — O365930 Maternal care for other known or suspected poor fetal growth, third trimester, not applicable or unspecified: Secondary | ICD-10-CM | POA: Insufficient documentation

## 2022-06-29 DIAGNOSIS — Z141 Cystic fibrosis carrier: Secondary | ICD-10-CM

## 2022-06-29 DIAGNOSIS — O09293 Supervision of pregnancy with other poor reproductive or obstetric history, third trimester: Secondary | ICD-10-CM

## 2022-06-29 DIAGNOSIS — O99332 Smoking (tobacco) complicating pregnancy, second trimester: Secondary | ICD-10-CM

## 2022-06-29 DIAGNOSIS — O285 Abnormal chromosomal and genetic finding on antenatal screening of mother: Secondary | ICD-10-CM | POA: Diagnosis not present

## 2022-06-29 DIAGNOSIS — O358XX0 Maternal care for other (suspected) fetal abnormality and damage, not applicable or unspecified: Secondary | ICD-10-CM

## 2022-06-29 DIAGNOSIS — Z3689 Encounter for other specified antenatal screening: Secondary | ICD-10-CM | POA: Diagnosis not present

## 2022-06-29 DIAGNOSIS — Z3A37 37 weeks gestation of pregnancy: Secondary | ICD-10-CM | POA: Diagnosis not present

## 2022-06-29 DIAGNOSIS — F17210 Nicotine dependence, cigarettes, uncomplicated: Secondary | ICD-10-CM | POA: Diagnosis not present

## 2022-06-29 DIAGNOSIS — O09523 Supervision of elderly multigravida, third trimester: Secondary | ICD-10-CM | POA: Diagnosis not present

## 2022-06-29 DIAGNOSIS — O0933 Supervision of pregnancy with insufficient antenatal care, third trimester: Secondary | ICD-10-CM | POA: Diagnosis not present

## 2022-06-30 ENCOUNTER — Other Ambulatory Visit: Payer: Self-pay | Admitting: Obstetrics and Gynecology

## 2022-06-30 ENCOUNTER — Telehealth: Payer: Self-pay | Admitting: Nurse Practitioner

## 2022-06-30 NOTE — Telephone Encounter (Signed)
This patient is schedule for tomorrow 9/27 at 10 am for Hemorrhoids during pregnancy in third trimesteror . Thank you

## 2022-06-30 NOTE — Telephone Encounter (Signed)
Noted  

## 2022-06-30 NOTE — Progress Notes (Unsigned)
     06/30/2022 ELISABEL HANOVER [id number] 05-25-1981   CHIEF COMPLAINT: Hemorrhoids   HISTORY OF PRESENT ILLNESS: Shakeisha L. Mathurin is a 41 year old female with a past medical history of  She is currently pregnant, third trimester.             Past Medical History:  Diagnosis Date   Headache    sepsis    pyleonephritis   Trichimoniasis    Vitamin D deficiency    Past Surgical History:  Procedure Laterality Date   NO PAST SURGERIES     Social History:  Family History:    reports that she has been smoking cigarettes. She has been smoking an average of 1 pack per day. She has never used smokeless tobacco. She reports that she does not drink alcohol and does not use drugs. family history includes Cancer in her maternal grandfather and paternal aunt; Diabetes in her maternal aunt and maternal grandmother; Hypertension in her maternal aunt and maternal grandmother.  Allergies  Allergen Reactions   Latex Swelling and Rash   Penicillins Swelling and Rash    Did it involve swelling of the face/tongue/throat, SOB, or low BP? Unknown Did it involve sudden or severe rash/hives, skin peeling, or any reaction on the inside of your mouth or nose? Yes Did you need to seek medical attention at a hospital or doctor's office? Unknown When did it last happen?   unk    If all above answers are "NO", may proceed with cephalosporin use.       Outpatient Encounter Medications as of 07/01/2022  Medication Sig   cholecalciferol (VITAMIN D3) 25 MCG (1000 UNIT) tablet Take 1,000 Units by mouth daily.   hydrocortisone-pramoxine (PROCTOFOAM HC) rectal foam Place 1 applicator rectally 2 (two) times daily.   Prenatal Vit-Fe Fumarate-FA (MULTIVITAMIN-PRENATAL) 27-0.8 MG TABS tablet Take 1 tablet by mouth daily at 12 noon.   witch hazel-glycerin (TUCKS) pad Apply 1 Application topically as needed for itching.   No facility-administered encounter medications on file as of 07/01/2022.      REVIEW OF SYSTEMS:  Gen: Denies fever, sweats or chills. No weight loss.  CV: Denies chest pain, palpitations or edema. Resp: Denies cough, shortness of breath of hemoptysis.  GI: Denies heartburn, dysphagia, stomach or lower abdominal pain. No diarrhea or constipation.  GU : Denies urinary burning, blood in urine, increased urinary frequency or incontinence. MS: Denies joint pain, muscles aches or weakness. Derm: Denies rash, itchiness, skin lesions or unhealing ulcers. Psych: Denies depression, anxiety, memory loss, suicidal ideation and confusion. Heme: Denies bruising, easy bleeding. Neuro:  Denies headaches, dizziness or paresthesias. Endo:  Denies any problems with DM, thyroid or adrenal function.  PHYSICAL EXAM: LMP 10/07/2021  General: Well developed ... in no acute distress. Head: Normocephalic and atraumatic. Eyes:  Sclerae non-icteric, conjunctive pink. Ears: Normal auditory acuity. Mouth: Dentition intact. No ulcers or lesions.  Neck: Supple, no lymphadenopathy or thyromegaly.  Lungs: Clear bilaterally to auscultation without wheezes, crackles or rhonchi. Heart: Regular rate and rhythm. No murmur, rub or gallop appreciated.  Abdomen: Soft, nontender, non distended. No masses. No hepatosplenomegaly. Normoactive bowel sounds x 4 quadrants.  Rectal:  Musculoskeletal: Symmetrical with no gross deformities. Skin: Warm and dry. No rash or lesions on visible extremities. Extremities: No edema. Neurological: Alert oriented x 4, no focal deficits.  Psychological:  Alert and cooperative. Normal mood and affect.  ASSESSMENT AND PLAN:    CC:  Benito Mccreedy, MD

## 2022-07-01 ENCOUNTER — Encounter (HOSPITAL_COMMUNITY): Payer: Self-pay | Admitting: *Deleted

## 2022-07-01 ENCOUNTER — Ambulatory Visit: Payer: Medicaid Other | Admitting: Nurse Practitioner

## 2022-07-01 ENCOUNTER — Telehealth (HOSPITAL_COMMUNITY): Payer: Self-pay | Admitting: *Deleted

## 2022-07-01 NOTE — Telephone Encounter (Signed)
Preadmission screen  

## 2022-07-05 DIAGNOSIS — Z419 Encounter for procedure for purposes other than remedying health state, unspecified: Secondary | ICD-10-CM | POA: Diagnosis not present

## 2022-07-07 ENCOUNTER — Other Ambulatory Visit: Payer: Self-pay

## 2022-07-07 ENCOUNTER — Inpatient Hospital Stay (HOSPITAL_COMMUNITY): Payer: Medicaid Other

## 2022-07-07 ENCOUNTER — Encounter (HOSPITAL_COMMUNITY): Payer: Self-pay | Admitting: Obstetrics and Gynecology

## 2022-07-07 ENCOUNTER — Inpatient Hospital Stay (HOSPITAL_COMMUNITY): Payer: Medicaid Other | Admitting: Anesthesiology

## 2022-07-07 ENCOUNTER — Inpatient Hospital Stay (HOSPITAL_COMMUNITY)
Admission: AD | Admit: 2022-07-07 | Discharge: 2022-07-10 | DRG: 798 | Disposition: A | Discharge status: Home / Self Care | Payer: Medicaid Other | Attending: Obstetrics and Gynecology | Admitting: Obstetrics and Gynecology

## 2022-07-07 DIAGNOSIS — F17210 Nicotine dependence, cigarettes, uncomplicated: Secondary | ICD-10-CM | POA: Diagnosis present

## 2022-07-07 DIAGNOSIS — Z3490 Encounter for supervision of normal pregnancy, unspecified, unspecified trimester: Principal | ICD-10-CM | POA: Diagnosis present

## 2022-07-07 DIAGNOSIS — Z302 Encounter for sterilization: Secondary | ICD-10-CM | POA: Diagnosis not present

## 2022-07-07 DIAGNOSIS — O99334 Smoking (tobacco) complicating childbirth: Secondary | ICD-10-CM | POA: Diagnosis not present

## 2022-07-07 DIAGNOSIS — O365930 Maternal care for other known or suspected poor fetal growth, third trimester, not applicable or unspecified: Principal | ICD-10-CM | POA: Diagnosis present

## 2022-07-07 DIAGNOSIS — Z141 Cystic fibrosis carrier: Secondary | ICD-10-CM | POA: Diagnosis not present

## 2022-07-07 DIAGNOSIS — O9902 Anemia complicating childbirth: Secondary | ICD-10-CM | POA: Diagnosis present

## 2022-07-07 DIAGNOSIS — Z3A39 39 weeks gestation of pregnancy: Secondary | ICD-10-CM | POA: Diagnosis not present

## 2022-07-07 DIAGNOSIS — Z880 Allergy status to penicillin: Secondary | ICD-10-CM | POA: Diagnosis not present

## 2022-07-07 DIAGNOSIS — J45909 Unspecified asthma, uncomplicated: Secondary | ICD-10-CM | POA: Diagnosis not present

## 2022-07-07 DIAGNOSIS — O9952 Diseases of the respiratory system complicating childbirth: Secondary | ICD-10-CM | POA: Diagnosis not present

## 2022-07-07 HISTORY — DX: Unspecified asthma, uncomplicated: J45.909

## 2022-07-07 LAB — CBC
HCT: 31.5 % — ABNORMAL LOW (ref 36.0–46.0)
Hemoglobin: 10.3 g/dL — ABNORMAL LOW (ref 12.0–15.0)
MCH: 27.3 pg (ref 26.0–34.0)
MCHC: 32.7 g/dL (ref 30.0–36.0)
MCV: 83.6 fL (ref 80.0–100.0)
Platelets: 198 10*3/uL (ref 150–400)
RBC: 3.77 MIL/uL — ABNORMAL LOW (ref 3.87–5.11)
RDW: 13.8 % (ref 11.5–15.5)
WBC: 11.9 10*3/uL — ABNORMAL HIGH (ref 4.0–10.5)
nRBC: 0.3 % — ABNORMAL HIGH (ref 0.0–0.2)

## 2022-07-07 LAB — RPR: RPR Ser Ql: NONREACTIVE

## 2022-07-07 LAB — TYPE AND SCREEN
ABO/RH(D): B POS
Antibody Screen: NEGATIVE

## 2022-07-07 MED ORDER — MISOPROSTOL 25 MCG QUARTER TABLET
25.0000 ug | ORAL_TABLET | Freq: Once | ORAL | Status: AC
  Administered 2022-07-07: 25 ug via ORAL
  Filled 2022-07-07: qty 1

## 2022-07-07 MED ORDER — LACTATED RINGERS IV SOLN: INTRAVENOUS | Status: DC

## 2022-07-07 MED ORDER — LIDOCAINE HCL (PF) 1 % IJ SOLN
INTRAMUSCULAR | Status: DC | PRN
  Administered 2022-07-07: 5 mL via EPIDURAL

## 2022-07-07 MED ORDER — ACETAMINOPHEN 325 MG PO TABS: 650.0000 mg | ORAL_TABLET | ORAL | Status: DC | PRN

## 2022-07-07 MED ORDER — EPHEDRINE 5 MG/ML INJ: 10.0000 mg | INTRAVENOUS | Status: DC | PRN

## 2022-07-07 MED ORDER — LACTATED RINGERS IV SOLN: 500.0000 mL | INTRAVENOUS | Status: DC | PRN

## 2022-07-07 MED ORDER — MISOPROSTOL 25 MCG QUARTER TABLET
25.0000 ug | ORAL_TABLET | ORAL | Status: DC
  Administered 2022-07-07 (×3): 25 ug via VAGINAL
  Filled 2022-07-07 (×3): qty 1

## 2022-07-07 MED ORDER — LACTATED RINGERS IV SOLN: 500.0000 mL | Freq: Once | INTRAVENOUS | Status: DC

## 2022-07-07 MED ORDER — FENTANYL-BUPIVACAINE-NACL 0.5-0.125-0.9 MG/250ML-% EP SOLN
12.0000 mL/h | EPIDURAL | Status: DC | PRN
  Filled 2022-07-07: qty 250

## 2022-07-07 MED ORDER — LIDOCAINE HCL (PF) 1 % IJ SOLN: 30.0000 mL | INTRAMUSCULAR | Status: DC | PRN

## 2022-07-07 MED ORDER — SOD CITRATE-CITRIC ACID 500-334 MG/5ML PO SOLN: 30.0000 mL | ORAL | Status: DC | PRN

## 2022-07-07 MED ORDER — FENTANYL CITRATE (PF) 100 MCG/2ML IJ SOLN
100.0000 ug | INTRAMUSCULAR | Status: DC | PRN
  Administered 2022-07-07: 100 ug via INTRAVENOUS
  Filled 2022-07-07: qty 2

## 2022-07-07 MED ORDER — MISOPROSTOL 25 MCG QUARTER TABLET
25.0000 ug | ORAL_TABLET | Freq: Once | ORAL | Status: AC
  Administered 2022-07-07: 25 ug via VAGINAL
  Filled 2022-07-07: qty 1

## 2022-07-07 MED ORDER — PHENYLEPHRINE 80 MCG/ML (10ML) SYRINGE FOR IV PUSH (FOR BLOOD PRESSURE SUPPORT): 80.0000 ug | PREFILLED_SYRINGE | INTRAVENOUS | Status: DC | PRN

## 2022-07-07 MED ORDER — OXYTOCIN-SODIUM CHLORIDE 30-0.9 UT/500ML-% IV SOLN
2.5000 [IU]/h | INTRAVENOUS | Status: DC
  Administered 2022-07-08: 2.5 [IU]/h via INTRAVENOUS
  Filled 2022-07-07: qty 500

## 2022-07-07 MED ORDER — DIPHENHYDRAMINE HCL 50 MG/ML IJ SOLN: 12.5000 mg | INTRAMUSCULAR | Status: DC | PRN

## 2022-07-07 MED ORDER — ONDANSETRON HCL 4 MG/2ML IJ SOLN: 4.0000 mg | Freq: Four times a day (QID) | INTRAMUSCULAR | Status: DC | PRN

## 2022-07-07 MED ORDER — TERBUTALINE SULFATE 1 MG/ML IJ SOLN: 0.2500 mg | Freq: Once | INTRAMUSCULAR | Status: DC | PRN

## 2022-07-07 MED ORDER — FENTANYL-BUPIVACAINE-NACL 0.5-0.125-0.9 MG/250ML-% EP SOLN
EPIDURAL | Status: DC | PRN
  Administered 2022-07-07: 12 mL/h via EPIDURAL

## 2022-07-07 MED ORDER — OXYTOCIN BOLUS FROM INFUSION
333.0000 mL | Freq: Once | INTRAVENOUS | Status: AC
  Administered 2022-07-08: 333 mL via INTRAVENOUS

## 2022-07-07 NOTE — Progress Notes (Signed)
Pt comfortable BP 115/77   Pulse 68   Temp 98.6 F (37 C) (Oral)   Resp 16   Ht 5\' 2"  (1.575 m)   Wt 66.1 kg   LMP 10/07/2021   BMI 26.65 kg/m  Cat 1  Toco irreg Cx 1/thick/-2 Cytotec #3 placed Continue cervical ripening Anticipate SVD

## 2022-07-07 NOTE — Anesthesia Procedure Notes (Signed)
Epidural Patient location during procedure: OB Start time: 07/07/2022 9:51 PM End time: 07/07/2022 10:05 PM  Staffing Anesthesiologist: Barnet Glasgow, MD Performed: anesthesiologist   Preanesthetic Checklist Completed: patient identified, IV checked, site marked, risks and benefits discussed, surgical consent, monitors and equipment checked, pre-op evaluation and timeout performed  Epidural Patient position: sitting Prep: DuraPrep and site prepped and draped Patient monitoring: continuous pulse ox and blood pressure Approach: midline Location: L3-L4 Injection technique: LOR air  Needle:  Needle type: Tuohy  Needle gauge: 17 G Needle length: 9 cm and 9 Needle insertion depth: 6 cm Catheter type: closed end flexible Catheter size: 19 Gauge Catheter at skin depth: 12 cm Test dose: negative  Assessment Events: blood not aspirated, injection not painful, no injection resistance, no paresthesia and negative IV test  Additional Notes Patient identified. Risks/Benefits/Options discussed with patient including but not limited to bleeding, infection, nerve damage, paralysis, failed block, incomplete pain control, headache, blood pressure changes, nausea, vomiting, reactions to medication both or allergic, itching and postpartum back pain. Confirmed with bedside nurse the patient's most recent platelet count. Confirmed with patient that they are not currently taking any anticoagulation, have any bleeding history or any family history of bleeding disorders. Patient expressed understanding and wished to proceed. All questions were answered. Sterile technique was used throughout the entire procedure. Please see nursing notes for vital signs. Test dose was given through epidural needle and negative prior to continuing to dose epidural or start infusion. Warning signs of high block given to the patient including shortness of breath, tingling/numbness in hands, complete motor block, or any  concerning symptoms with instructions to call for help. Patient was given instructions on fall risk and not to get out of bed. All questions and concerns addressed with instructions to call with any issues.1  Attempt (S) . Patient tolerated procedure well.

## 2022-07-07 NOTE — Anesthesia Preprocedure Evaluation (Signed)
Anesthesia Evaluation  Patient identified by MRN, date of birth, ID band Patient awake    Reviewed: Allergy & Precautions, NPO status , Patient's Chart, lab work & pertinent test results  Airway Mallampati: II  TM Distance: >3 FB Neck ROM: Full    Dental no notable dental hx. (+) Teeth Intact, Dental Advisory Given   Pulmonary asthma , Current Smoker and Patient abstained from smoking.,    Pulmonary exam normal breath sounds clear to auscultation       Cardiovascular Normal cardiovascular exam Rhythm:Regular Rate:Normal     Neuro/Psych  Headaches,    GI/Hepatic negative GI ROS, Neg liver ROS,   Endo/Other  negative endocrine ROS  Renal/GU      Musculoskeletal negative musculoskeletal ROS (+)   Abdominal   Peds  Hematology negative hematology ROS (+) Lab Results      Component                Value               Date                      WBC                      11.9 (H)            07/07/2022                HGB                      10.3 (L)            07/07/2022                HCT                      31.5 (L)            07/07/2022                MCV                      83.6                07/07/2022                PLT                      198                 07/07/2022              Anesthesia Other Findings All: latex PCN  Reproductive/Obstetrics (+) Pregnancy                             Anesthesia Physical Anesthesia Plan  ASA: 2  Anesthesia Plan: Epidural   Post-op Pain Management:    Induction:   PONV Risk Score and Plan:   Airway Management Planned:   Additional Equipment:   Intra-op Plan:   Post-operative Plan:   Informed Consent: I have reviewed the patients History and Physical, chart, labs and discussed the procedure including the risks, benefits and alternatives for the proposed anesthesia with the patient or authorized representative who has indicated his/her  understanding and acceptance.       Plan Discussed with:   Anesthesia Plan Comments: (39  wk G7P4 for LEA )        Anesthesia Quick Evaluation

## 2022-07-07 NOTE — Progress Notes (Signed)
VSS FHT min-mod variability s/p fentanyl with no decels overall reassuring and cat 1 Ctxs q2-67min S/p cytotec #4 at 1930 GBS neg

## 2022-07-07 NOTE — H&P (Addendum)
Rachel Horn is a 41 y.o. female presenting for induction of labor for small for gestational age fetus.  [id number] at 39 weeks W EGA, [facility] 07/14/22 by LMP 10/07/21.   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 Severe IUGR in pregnancy that resolved (EFW 4.5% at 30 weeks. EFW 11% at 34 weeks, 12th%(6lbs) at [redacted] weeks EGA). Chronic headache disorder. 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 Anatomy ultrasound that showed abnormal narrowing versus abnormal location of aortic arch versus suboptimal views and patient was referred to Pediatric Cardiology for a fetal echo. Asthma history, no current medication use. Cigarette smoker, used 1 pack every 2 days, nicotine patch prescribed in pregnancy. Advanced maternal age. Desires postpartum tubal ligation,consent form signed 04/28/2022.    OB History     Gravida  7   Para  4   Term  4   Preterm      AB  2   Living  4      SAB  0   IAB  1   Ectopic  1   Multiple      Live Births  4          Past Medical History:  Diagnosis Date   Asthma    Headache    History of pre-eclampsia in prior pregnancy, currently pregnant    sepsis    pyleonephritis   Trichimoniasis    Vitamin D deficiency    Past Surgical History:  Procedure Laterality Date   NO PAST SURGERIES     Family History: family history includes Cancer in her maternal grandfather and paternal aunt; Diabetes in her maternal aunt and maternal grandmother; Hypertension in her maternal aunt and maternal grandmother. Social History:  reports that she has been smoking cigarettes. She has been smoking an average of .5 packs per day. She has never used smokeless tobacco. She reports that she does not drink alcohol and does not use drugs.  Current Outpatient Medications  Medication Instructions   cholecalciferol (VITAMIN D3) 1,000 Units, Oral, Daily   hydrocortisone-pramoxine (PROCTOFOAM HC) rectal foam 1  applicator, Rectal, 2 times daily   Prenatal Vit-Fe Fumarate-FA (MULTIVITAMIN-PRENATAL) 27-0.8 MG TABS tablet 1 tablet, Oral, Daily   witch hazel-glycerin (TUCKS) pad 1 Application, Topical, As needed    Allergies  Allergen Reactions   Latex Swelling and Rash   Penicillins Swelling and Rash    Did it involve swelling of the face/tongue/throat, SOB, or low BP? Unknown Did it involve sudden or severe rash/hives, skin peeling, or any reaction on the inside of your mouth or nose? Yes Did you need to seek medical attention at a hospital or doctor's office? Unknown When did it last happen?   unk    If all above answers are "NO", may proceed with cephalosporin use.       Maternal Diabetes: No Genetic Screening: Abnormal:  Results: Other: Cystic fibrosis carrier. Low risk panorama, Negative AFP only.  Maternal Ultrasounds/Referrals: Other: Fetal Ultrasounds or other Referrals:  Referred to Materal Fetal Medicine  Maternal Substance Abuse:  Yes:  Type: Smoker Significant Maternal Medications:  None Significant Maternal Lab Results:  Group B Strep negative Number of Prenatal Visits:greater than 3 verified prenatal visits Other Comments:  None  Review of Systems Constitutional: Denies fevers/chills Cardiovascular: Denies chest pain or palpitations Pulmonary: Denies coughing or wheezing Gastrointestinal: Denies nausea, vomiting or diarrhea Genitourinary: Denies pelvic pain, unusual vaginal bleeding, unusual vaginal discharge, dysuria, urgency or frequency.  Musculoskeletal:  Denies muscle or joint aches and pain.  Neurology: Denies abnormal sensations such as tingling or numbness.   History Dilation: 1 Effacement (%): Thick Station: -3 Exam by:: k fields, rn Blood pressure 117/78, pulse 62, temperature 98.7 F (37.1 C), temperature source Oral, resp. rate 16, height 5\' 2"  (1.575 m), weight 66.1 kg, last menstrual period 10/07/2021. Exam Physical Exam  Blood pressure 117/78, pulse 62,  temperature 98.7 F (37.1 C), temperature source Oral, resp. rate 16, height 5\' 2"  (1.575 m), weight 66.1 kg, last menstrual period 10/07/2021.   Prenatal labs: ABO, Rh: --/--/B POS (10/03 [id number]) Antibody: NEG (10/03 0718) Rubella: Immune (04/25 0000) RPR: Nonreactive (04/25 0000)  HBsAg: Negative (04/25 0000)  HIV: Non-reactive (04/25 0000)  GBS: Negative/-- (09/11 0000)   Recent Results (from the past 2160 hour(s))  Fern Test     Status: Normal   Collection Time: 06/13/22  3:39 AM  Result Value Ref Range   POCT Fern Test     Negative    Wet prep, genital     Status: None   Collection Time: 06/13/22  3:39 AM   Specimen: Cervix  Result Value Ref Range   Yeast Wet Prep HPF POC NONE SEEN NONE SEEN   Trich, Wet Prep NONE SEEN NONE SEEN   Clue Cells Wet Prep HPF POC NONE SEEN NONE SEEN   WBC, Wet Prep HPF POC <10 <10   Sperm NONE SEEN     Comment: Performed at [facility], [place] [address], [place], [place] [address]  OB RESULT CONSOLE Group B Strep     Status: None   Collection Time: 06/15/22 12:00 AM  Result Value Ref Range   GBS Negative   CBC     Status: Abnormal   Collection Time: 07/07/22  7:18 AM  Result Value Ref Range   WBC 11.9 (H) 4.0 - 10.5 K/uL   RBC 3.77 (L) 3.87 - 5.11 MIL/uL   Hemoglobin 10.3 (L) 12.0 - 15.0 g/dL   HCT 31.5 (L) 36.0 - 46.0 %   MCV 83.6 80.0 - 100.0 fL   MCH 27.3 26.0 - 34.0 pg   MCHC 32.7 30.0 - 36.0 g/dL   RDW 13.8 11.5 - 15.5 %   Platelets 198 150 - 400 K/uL   nRBC 0.3 (H) 0.0 - 0.2 %    Comment: Performed at [facility], [place] [address], [place], [place] [address]  RPR     Status: None   Collection Time: 07/07/22  7:18 AM  Result Value Ref Range   RPR Ser Ql NON REACTIVE NON REACTIVE    Comment: Performed at [facility], [place] [address] [place], [place] [address]  Type and screen [place]     Status: None   Collection Time: 07/07/22  7:18 AM  Result Value Ref Range   ABO/RH(D) B POS    Antibody  Screen NEG    Sample Expiration      07/10/2022,2359 Performed at [facility], [place] [address] [place], [place] [address]     Assessment/Plan: 40 y/o [id number] at [redacted] weeks EGA here for induction of labor for small for gestational age fetus,  - Admit to Labor and delivery - Induction orders as placed.  Will start with cervical ripening with cytotec.   - Patient desires postpartum tubal ligation,consent form signed 04/28/2022.  - Patient signed out to incoming provider on call at 7 am.   Archie Endo, MD.  07/07/2022, 12:41 PM

## 2022-07-08 ENCOUNTER — Encounter (HOSPITAL_COMMUNITY): Payer: Self-pay | Admitting: Obstetrics & Gynecology

## 2022-07-08 DIAGNOSIS — Z3A39 39 weeks gestation of pregnancy: Secondary | ICD-10-CM | POA: Diagnosis not present

## 2022-07-08 MED ORDER — DIPHENHYDRAMINE HCL 25 MG PO CAPS: 25.0000 mg | ORAL_CAPSULE | Freq: Four times a day (QID) | ORAL | Status: DC | PRN

## 2022-07-08 MED ORDER — OXYCODONE HCL 5 MG PO TABS
10.0000 mg | ORAL_TABLET | ORAL | Status: DC | PRN
  Administered 2022-07-09 – 2022-07-10 (×4): 10 mg via ORAL
  Filled 2022-07-08 (×4): qty 2

## 2022-07-08 MED ORDER — WITCH HAZEL-GLYCERIN EX PADS
1.0000 | MEDICATED_PAD | CUTANEOUS | Status: DC | PRN
  Administered 2022-07-08: 1 via TOPICAL

## 2022-07-08 MED ORDER — SENNOSIDES-DOCUSATE SODIUM 8.6-50 MG PO TABS
2.0000 | ORAL_TABLET | Freq: Every day | ORAL | Status: DC
  Filled 2022-07-08: qty 2

## 2022-07-08 MED ORDER — ONDANSETRON HCL 4 MG/2ML IJ SOLN: 4.0000 mg | INTRAMUSCULAR | Status: DC | PRN

## 2022-07-08 MED ORDER — ZOLPIDEM TARTRATE 5 MG PO TABS: 5.0000 mg | ORAL_TABLET | Freq: Every evening | ORAL | Status: DC | PRN

## 2022-07-08 MED ORDER — ONDANSETRON HCL 4 MG PO TABS: 4.0000 mg | ORAL_TABLET | ORAL | Status: DC | PRN

## 2022-07-08 MED ORDER — IBUPROFEN 600 MG PO TABS
600.0000 mg | ORAL_TABLET | Freq: Four times a day (QID) | ORAL | Status: DC
  Administered 2022-07-08 – 2022-07-10 (×8): 600 mg via ORAL
  Filled 2022-07-08 (×9): qty 1

## 2022-07-08 MED ORDER — FAMOTIDINE 20 MG PO TABS: 40.0000 mg | ORAL_TABLET | Freq: Once | ORAL | Status: DC

## 2022-07-08 MED ORDER — TETANUS-DIPHTH-ACELL PERTUSSIS 5-2.5-18.5 LF-MCG/0.5 IM SUSY: 0.5000 mL | PREFILLED_SYRINGE | Freq: Once | INTRAMUSCULAR | Status: DC

## 2022-07-08 MED ORDER — BENZOCAINE-MENTHOL 20-0.5 % EX AERO
1.0000 | INHALATION_SPRAY | CUTANEOUS | Status: DC | PRN
  Administered 2022-07-08: 1 via TOPICAL
  Filled 2022-07-08: qty 56

## 2022-07-08 MED ORDER — OXYTOCIN-SODIUM CHLORIDE 30-0.9 UT/500ML-% IV SOLN: 2.5000 [IU]/h | INTRAVENOUS | Status: DC | PRN

## 2022-07-08 MED ORDER — SIMETHICONE 80 MG PO CHEW: 80.0000 mg | CHEWABLE_TABLET | ORAL | Status: DC | PRN

## 2022-07-08 MED ORDER — OXYCODONE HCL 5 MG PO TABS
5.0000 mg | ORAL_TABLET | ORAL | Status: DC | PRN
  Administered 2022-07-08: 5 mg via ORAL
  Filled 2022-07-08: qty 1

## 2022-07-08 MED ORDER — PRENATAL MULTIVITAMIN CH
1.0000 | ORAL_TABLET | Freq: Every day | ORAL | Status: DC
  Administered 2022-07-08: 1 via ORAL
  Filled 2022-07-08 (×2): qty 1

## 2022-07-08 MED ORDER — METOCLOPRAMIDE HCL 10 MG PO TABS: 10.0000 mg | ORAL_TABLET | Freq: Once | ORAL | Status: DC

## 2022-07-08 MED ORDER — LACTATED RINGERS IV SOLN: INTRAVENOUS | Status: DC

## 2022-07-08 MED ORDER — COCONUT OIL OIL: 1.0000 | TOPICAL_OIL | Status: DC | PRN

## 2022-07-08 MED ORDER — ACETAMINOPHEN 325 MG PO TABS
650.0000 mg | ORAL_TABLET | ORAL | Status: DC | PRN
  Administered 2022-07-08 – 2022-07-09 (×2): 650 mg via ORAL
  Filled 2022-07-08 (×2): qty 2

## 2022-07-08 MED ORDER — DIBUCAINE (PERIANAL) 1 % EX OINT
1.0000 | TOPICAL_OINTMENT | CUTANEOUS | Status: DC | PRN
  Administered 2022-07-08: 1 via RECTAL
  Filled 2022-07-08: qty 28

## 2022-07-08 NOTE — Anesthesia Postprocedure Evaluation (Signed)
Anesthesia Post Note  Patient: Rachel Horn  Procedure(s) Performed: AN AD [place]     Patient location during evaluation: Mother Baby Anesthesia Type: Epidural Level of consciousness: awake and alert Pain management: pain level controlled Vital Signs Assessment: post-procedure vital signs reviewed and stable Respiratory status: spontaneous breathing, nonlabored ventilation and respiratory function stable Cardiovascular status: stable Postop Assessment: no headache, no backache and epidural receding Anesthetic complications: no   No notable events documented.  Last Vitals:  Vitals:   07/08/22 0310 07/08/22 0500  BP: 124/85 125/84  Pulse: 70 61  Resp: 18 18  Temp: 36.8 C 36.7 C  SpO2: 100% 100%    Last Pain:  Vitals:   07/08/22 0500  TempSrc: Oral  PainSc: 3    Pain Goal:                   Ailene Ards

## 2022-07-09 ENCOUNTER — Encounter (HOSPITAL_COMMUNITY)
Admission: AD | Disposition: A | Discharge status: Home / Self Care | Payer: Self-pay | Source: Home / Self Care | Attending: Obstetrics and Gynecology

## 2022-07-09 ENCOUNTER — Inpatient Hospital Stay (HOSPITAL_COMMUNITY): Payer: Medicaid Other | Admitting: Anesthesiology

## 2022-07-09 DIAGNOSIS — Z302 Encounter for sterilization: Secondary | ICD-10-CM

## 2022-07-09 HISTORY — PX: TUBAL LIGATION: SHX77

## 2022-07-09 LAB — CBC
HCT: 27.8 % — ABNORMAL LOW (ref 36.0–46.0)
Hemoglobin: 8.9 g/dL — ABNORMAL LOW (ref 12.0–15.0)
MCH: 26.7 pg (ref 26.0–34.0)
MCHC: 32 g/dL (ref 30.0–36.0)
MCV: 83.5 fL (ref 80.0–100.0)
Platelets: 157 10*3/uL (ref 150–400)
RBC: 3.33 MIL/uL — ABNORMAL LOW (ref 3.87–5.11)
RDW: 13.9 % (ref 11.5–15.5)
WBC: 10.9 10*3/uL — ABNORMAL HIGH (ref 4.0–10.5)
nRBC: 0 % (ref 0.0–0.2)

## 2022-07-09 SURGERY — LIGATION, FALLOPIAN TUBE, POSTPARTUM
Anesthesia: Spinal | Laterality: Bilateral

## 2022-07-09 MED ORDER — KETOROLAC TROMETHAMINE 30 MG/ML IJ SOLN
30.0000 mg | Freq: Once | INTRAMUSCULAR | Status: AC | PRN
  Administered 2022-07-09: 30 mg via INTRAVENOUS

## 2022-07-09 MED ORDER — ONDANSETRON HCL 4 MG/2ML IJ SOLN
INTRAMUSCULAR | Status: DC | PRN
  Administered 2022-07-09: 4 mg via INTRAVENOUS

## 2022-07-09 MED ORDER — BUPIVACAINE IN DEXTROSE 0.75-8.25 % IT SOLN
INTRATHECAL | Status: DC | PRN
  Administered 2022-07-09: 1.4 mg via INTRATHECAL

## 2022-07-09 MED ORDER — POLYSACCHARIDE IRON COMPLEX 150 MG PO CAPS
150.0000 mg | ORAL_CAPSULE | Freq: Every day | ORAL | Status: DC
  Filled 2022-07-09: qty 1

## 2022-07-09 MED ORDER — KETOROLAC TROMETHAMINE 30 MG/ML IJ SOLN
INTRAMUSCULAR | Status: AC
  Filled 2022-07-09: qty 1

## 2022-07-09 MED ORDER — FENTANYL CITRATE (PF) 100 MCG/2ML IJ SOLN
INTRAMUSCULAR | Status: DC | PRN
  Administered 2022-07-09: 15 ug via INTRATHECAL

## 2022-07-09 MED ORDER — OXYCODONE HCL 5 MG PO TABS: 5.0000 mg | ORAL_TABLET | Freq: Once | ORAL | Status: DC | PRN

## 2022-07-09 MED ORDER — FENTANYL CITRATE (PF) 100 MCG/2ML IJ SOLN
INTRAMUSCULAR | Status: AC
  Filled 2022-07-09: qty 2

## 2022-07-09 MED ORDER — LIDOCAINE HCL (PF) 1 % IJ SOLN
INTRAMUSCULAR | Status: AC
  Filled 2022-07-09: qty 30

## 2022-07-09 MED ORDER — LIDOCAINE-EPINEPHRINE (PF) 2 %-1:200000 IJ SOLN
INTRAMUSCULAR | Status: AC
  Filled 2022-07-09: qty 10

## 2022-07-09 MED ORDER — MIDAZOLAM HCL 2 MG/2ML IJ SOLN
INTRAMUSCULAR | Status: AC
  Filled 2022-07-09: qty 2

## 2022-07-09 MED ORDER — PROMETHAZINE HCL 25 MG/ML IJ SOLN: 6.2500 mg | INTRAMUSCULAR | Status: DC | PRN

## 2022-07-09 MED ORDER — AMISULPRIDE (ANTIEMETIC) 5 MG/2ML IV SOLN: 10.0000 mg | Freq: Once | INTRAVENOUS | Status: DC | PRN

## 2022-07-09 MED ORDER — FAMOTIDINE 20 MG PO TABS
40.0000 mg | ORAL_TABLET | Freq: Once | ORAL | Status: AC
  Administered 2022-07-09: 40 mg via ORAL
  Filled 2022-07-09: qty 2

## 2022-07-09 MED ORDER — STERILE WATER FOR IRRIGATION IR SOLN
Status: DC | PRN
  Administered 2022-07-09: 1000 mL

## 2022-07-09 MED ORDER — LACTATED RINGERS IV SOLN: INTRAVENOUS | Status: DC | PRN

## 2022-07-09 MED ORDER — LIDOCAINE-EPINEPHRINE (PF) 2 %-1:200000 IJ SOLN
INTRAMUSCULAR | Status: DC | PRN
  Administered 2022-07-09: 6.5 mL via INTRADERMAL

## 2022-07-09 MED ORDER — ONDANSETRON HCL 4 MG/2ML IJ SOLN
INTRAMUSCULAR | Status: AC
  Filled 2022-07-09: qty 2

## 2022-07-09 MED ORDER — MIDAZOLAM HCL 5 MG/5ML IJ SOLN
INTRAMUSCULAR | Status: DC | PRN
  Administered 2022-07-09 (×2): 1 mg via INTRAVENOUS

## 2022-07-09 MED ORDER — OXYCODONE HCL 5 MG/5ML PO SOLN: 5.0000 mg | Freq: Once | ORAL | Status: DC | PRN

## 2022-07-09 MED ORDER — METOCLOPRAMIDE HCL 10 MG PO TABS
10.0000 mg | ORAL_TABLET | Freq: Once | ORAL | Status: AC
  Administered 2022-07-09: 10 mg via ORAL
  Filled 2022-07-09: qty 1

## 2022-07-09 MED ORDER — FENTANYL CITRATE (PF) 100 MCG/2ML IJ SOLN: 25.0000 ug | INTRAMUSCULAR | Status: DC | PRN

## 2022-07-09 SURGICAL SUPPLY — 23 items
CLOTH BEACON ORANGE TIMEOUT ST (SAFETY) ×2 IMPLANT
DRSG OPSITE POSTOP 3X4 (GAUZE/BANDAGES/DRESSINGS) IMPLANT
DRSG OPSITE POSTOP 4X10 (GAUZE/BANDAGES/DRESSINGS) IMPLANT
DURAPREP 26ML APPLICATOR (WOUND CARE) ×2 IMPLANT
ELECT REM PT RETURN 9FT ADLT (ELECTROSURGICAL)
ELECTRODE REM PT RTRN 9FT ADLT (ELECTROSURGICAL) IMPLANT
GLOVE BIOGEL PI IND STRL 7.0 (GLOVE) ×4 IMPLANT
GLOVE ECLIPSE 6.5 STRL STRAW (GLOVE) ×2 IMPLANT
GOWN STRL REUS W/TWL LRG LVL3 (GOWN DISPOSABLE) ×4 IMPLANT
LIGASURE IMPACT 36 18CM CVD LR (INSTRUMENTS) IMPLANT
NEEDLE HYPO 22GX1.5 SAFETY (NEEDLE) ×2 IMPLANT
NS IRRIG 1000ML POUR BTL (IV SOLUTION) ×2 IMPLANT
PACK ABDOMINAL MINOR (CUSTOM PROCEDURE TRAY) ×2 IMPLANT
PENCIL BUTTON HOLSTER BLD 10FT (ELECTRODE) ×2 IMPLANT
PROTECTOR NERVE ULNAR (MISCELLANEOUS) ×2 IMPLANT
SPONGE LAP 4X18 RFD (DISPOSABLE) IMPLANT
SUT MON AB 4-0 PS1 27 (SUTURE) ×2 IMPLANT
SUT PLAIN 1 NONE 54 (SUTURE) ×2 IMPLANT
SUT VIC AB 0 CT1 27 (SUTURE) ×1
SUT VIC AB 0 CT1 27XBRD ANBCTR (SUTURE) ×2 IMPLANT
SYR CONTROL 10ML LL (SYRINGE) ×2 IMPLANT
TOWEL OR 17X24 6PK STRL BLUE (TOWEL DISPOSABLE) ×4 IMPLANT
TRAY FOLEY CATH SILVER 14FR (SET/KITS/TRAYS/PACK) ×2 IMPLANT

## 2022-07-09 NOTE — Anesthesia Postprocedure Evaluation (Signed)
Anesthesia Post Note  Patient: Rachel Horn  Procedure(s) Performed: POST PARTUM TUBAL LIGATION (Bilateral)     Patient location during evaluation: PACU Anesthesia Type: Spinal Level of consciousness: awake Pain management: pain level controlled Vital Signs Assessment: post-procedure vital signs reviewed and stable Respiratory status: spontaneous breathing, nonlabored ventilation, respiratory function stable and patient connected to nasal cannula oxygen Cardiovascular status: stable and blood pressure returned to baseline Postop Assessment: no apparent nausea or vomiting Anesthetic complications: no   No notable events documented.  Last Vitals:  Vitals:   07/09/22 1327 07/09/22 1423  BP: 115/78 134/85  Pulse: 60 (!) 56  Resp: 17 18  Temp: (!) 36.4 C (!) 36.4 C  SpO2: 99% 100%    Last Pain:  Vitals:   07/09/22 1423  TempSrc: Oral  PainSc: 0-No pain   Pain Goal: Patients Stated Pain Goal: 5 (07/09/22 1300)                 Shadd Dunstan P [place]

## 2022-07-09 NOTE — Op Note (Addendum)
Name: Rachel Horn DOB: [birth year] MRN: [id number] Date of procedure: 07/09/2022  PREOP DIAGNOSIS: Multiparous patient (Para 5) desiring permanent sterilization.  POSTOP DIAGNOSIS: Same as above.  PROCEDURE: Post partum bilateral tubal ligation via bilateral complete salpingectomy.  SURGEON: Dr. Waymon Amato.  ASSISTANT: None.   ANESTHESIA: Spinal.  COMPLICATIONS: None.  EBL: 5 mL.  IV FLUID: per anesthesia.   URINE OUTPUT: Foley catheter placed at end of case.    FINDINGS: Normal uterus. Normal fallopian tubes bilaterally.  Bilateral ovaries could not be visualized.    PROCEDURE:   Informed consent was obtained from the patient to undergo the procedure after discussing the risks benefits and alternatives of the procedure including risks of tubal regret, bleeding, infection and damage to organs.  She also understood that there are other temporary methods of birth control, and also female sterilization and she did not desire these other options.  She was taken to the operating room where anesthesia was administered without difficulty.  She was prepped abdominally in the usual sterile fashion.  2% lidocaine with epinephrine was instilled in the infraumbilical area, total used 7cc. The skin was incised with a scalpel about 3 cm, and and entry into the abdomen was made through the subcutaneous layer, fascia and peritoneal layers using Allis clamps, hemostats, retractors and scalpel.  The patient was tilted to her right side and the left fallopian tube was identified and followed up to the fimbria end through the incision. The left fallopian tube mesosalpinx was then ligated and cut with the Ligasure Impact and the tube was transected up to about 1 cm from uterine cornua. Excellent hemostasis was noted on remaining edges.  The patient was then tilted to her left side and the right fallopian tube identified, followed out to the fimbria end and similarly transected with the Ligasure Impact.   Excellent hemostasis was noted on the remaining edges.  The fascia was then closed using 0 Vicryl in a running stitch.  The skin was closed using 4-0 Monocryl subcuticular stitch.  Few abrasions on the skin the incision from instrument use was re approximated with 4-0 monocryl. The instrument and needle counts were correct. She was taken to the operating room in a stable condition.  SPECIMEN:  Left and right fallopian tubes to pathology.    Dr. Waymon Amato.  07/09/2022.

## 2022-07-09 NOTE — Anesthesia Procedure Notes (Signed)
Spinal  Patient location during procedure: OR Start time: 07/09/2022 11:10 AM End time: 07/09/2022 11:15 AM Reason for block: surgical anesthesia Staffing Performed: anesthesiologist  Anesthesiologist: Murvin Natal, MD Performed by: Murvin Natal, MD Authorized by: Murvin Natal, MD   Preanesthetic Checklist Completed: patient identified, IV checked, risks and benefits discussed, surgical consent, monitors and equipment checked, pre-op evaluation and timeout performed Spinal Block Patient position: sitting Prep: DuraPrep Patient monitoring: cardiac monitor, continuous pulse ox and blood pressure Approach: midline Location: L4-5 Injection technique: single-shot Needle Needle type: Pencan  Needle gauge: 24 G Needle length: 9 cm Assessment Sensory level: T10 Events: CSF return Additional Notes Functioning IV was confirmed and monitors were applied. Sterile prep and drape, including hand hygiene and sterile gloves were used. The patient was positioned and the spine was prepped. The skin was anesthetized with lidocaine.  Free flow of clear CSF was obtained prior to injecting local anesthetic into the CSF.  The spinal needle aspirated freely following injection.  The needle was carefully withdrawn.  The patient tolerated the procedure well.

## 2022-07-09 NOTE — H&P (View-Only) (Signed)
Post Partum Day 1 Subjective: no complaints, up ad lib, voiding, and tolerating PO  Objective: Blood pressure 122/80, pulse 69, temperature 98.5 F (36.9 C), temperature source Oral, resp. rate 18, height 5' 2" (1.575 m), weight 66.1 kg, last menstrual period 10/07/2021, SpO2 100 %, unknown if currently breastfeeding.  Physical Exam:  General: alert, cooperative, and no distress Lochia: appropriate Uterine Fundus: firm Incision: N/A DVT Evaluation: No evidence of DVT seen on physical exam.  Recent Labs    07/07/22 0718 07/09/22 0553  HGB 10.3* 8.9*  HCT 31.5* 27.8*    Assessment/Plan: -Plan for discharge tomorrow and Contraception Desires postpartum tubal ligation. - Routine postpartum care. - Iron tabs for anemia.  - Patient plans to use bilateral tubal ligation via complete bilateral salpingectomy for sterilization.  We discussed risks, benefits and alternatives of postpartum tubal ligation including but not limited to risks of bleeding, infection, damage to organs.  She also understood the risk of tubal regret but she states she is 100% sure she did not want any more children.  We discussed that complete removal of both tubes will ensure complete sterilization but as with every procedure it is not 100% guaranteed.  She understood there were other kinds of birth control such as pills, patches, IUDs, vaginal rings and depo provera which were temporary but she did not desire them.  She understood there was also an option of female sterilization but she did not desire that option either.  She did not desire partial salpingectomy or tubal fulguration or placement of tubal clips or Essure sterilization.  All her questions were answered and she was consented for the procedure.   LOS: 2 days   Rachel Wurtz W Wava Kildow, MD 07/09/2022, 11:24 PM    

## 2022-07-09 NOTE — Progress Notes (Addendum)
Pt reports that bilateral nipple and dermal piercings are unable to be removed. Naval, eyebrow, and all ear piercings to be removed prior to procedure without issue. OB OR  and Network engineer notified. Report to be given to [facility], Therapist, sports.  Rocky Crafts, RN 07/09/22

## 2022-07-09 NOTE — Progress Notes (Signed)
Post Partum Day 1 Subjective: no complaints, up ad lib, voiding, and tolerating PO  Objective: Blood pressure 122/80, pulse 69, temperature 98.5 F (36.9 C), temperature source Oral, resp. rate 18, height 5\' 2"  (1.575 m), weight 66.1 kg, last menstrual period 10/07/2021, SpO2 100 %, unknown if currently breastfeeding.  Physical Exam:  General: alert, cooperative, and no distress Lochia: appropriate Uterine Fundus: firm Incision: N/A DVT Evaluation: No evidence of DVT seen on physical exam.  Recent Labs    07/07/22 0718 07/09/22 0553  HGB 10.3* 8.9*  HCT 31.5* 27.8*    Assessment/Plan: -Plan for discharge tomorrow and Contraception Desires postpartum tubal ligation. - Routine postpartum care. - Iron tabs for anemia.  - Patient plans to use bilateral tubal ligation via complete bilateral salpingectomy for sterilization.  We discussed risks, benefits and alternatives of postpartum tubal ligation including but not limited to risks of bleeding, infection, damage to organs.  She also understood the risk of tubal regret but she states she is 100% sure she did not want any more children.  We discussed that complete removal of both tubes will ensure complete sterilization but as with every procedure it is not 100% guaranteed.  She understood there were other kinds of birth control such as pills, patches, IUDs, vaginal rings and depo provera which were temporary but she did not desire them.  She understood there was also an option of female sterilization but she did not desire that option either.  She did not desire partial salpingectomy or tubal fulguration or placement of tubal clips or Essure sterilization.  All her questions were answered and she was consented for the procedure.   LOS: 2 days   Archie Endo, MD 07/09/2022, 11:24 PM

## 2022-07-09 NOTE — Transfer of Care (Signed)
Immediate Anesthesia Transfer of Care Note  Patient: Rachel Horn  Procedure(s) Performed: POST PARTUM TUBAL LIGATION (Bilateral)  Patient Location: PACU  Anesthesia Type:Spinal  Level of Consciousness: awake  Airway & Oxygen Therapy: Patient Spontanous Breathing  Post-op Assessment: Report given to RN and Post -op Vital signs reviewed and stable  Post vital signs: Reviewed and stable  Last Vitals:  Vitals Value Taken Time  BP 100/66 07/09/22 1208  Temp    Pulse 59 07/09/22 1211  Resp 16 07/09/22 1211  SpO2 95 % 07/09/22 1211  Vitals shown include unvalidated device data.  Last Pain:  Vitals:   07/09/22 0915  TempSrc: Axillary  PainSc:          Complications: No notable events documented.

## 2022-07-09 NOTE — Anesthesia Preprocedure Evaluation (Addendum)
Anesthesia Evaluation  Patient identified by MRN, date of birth, ID band Patient awake    Reviewed: Allergy & Precautions, NPO status , Patient's Chart, lab work & pertinent test results  Airway Mallampati: II  TM Distance: >3 FB Neck ROM: Full    Dental  (+) Chipped, Poor Dentition, Loose   Pulmonary asthma , Current Smoker and Patient abstained from smoking.,    Pulmonary exam normal        Cardiovascular negative cardio ROS Normal cardiovascular exam     Neuro/Psych  Headaches, negative psych ROS   GI/Hepatic negative GI ROS, Neg liver ROS,   Endo/Other  negative endocrine ROS  Renal/GU negative Renal ROS     Musculoskeletal negative musculoskeletal ROS (+)   Abdominal   Peds  Hematology  (+) Blood dyscrasia, anemia ,   Anesthesia Other Findings PP BTL  Reproductive/Obstetrics                            Anesthesia Physical Anesthesia Plan  ASA: 2  Anesthesia Plan: Spinal   Post-op Pain Management:    Induction: Intravenous  PONV Risk Score and Plan: 1 and Ondansetron, Dexamethasone and Treatment may vary due to age or medical condition  Airway Management Planned: Natural Airway  Additional Equipment:   Intra-op Plan:   Post-operative Plan:   Informed Consent: I have reviewed the patients History and Physical, chart, labs and discussed the procedure including the risks, benefits and alternatives for the proposed anesthesia with the patient or authorized representative who has indicated his/her understanding and acceptance.     Dental advisory given  Plan Discussed with: CRNA  Anesthesia Plan Comments:         Anesthesia Quick Evaluation

## 2022-07-09 NOTE — Interval H&P Note (Signed)
History and Physical Interval Note:  07/09/2022 11:28 PM  Rachel Horn  has presented today for surgery, with the diagnosis of desires sterilization postpartum.  The various methods of treatment have been discussed with the patient and family. After consideration of risks, benefits and other options for treatment, the patient has consented to  Procedure(s): POST PARTUM TUBAL LIGATION (Bilateral) as a surgical intervention.  The patient's history has been reviewed, patient examined, no change in status, stable for surgery.  I have reviewed the patient's chart and labs.  Questions were answered to the patient's satisfaction.     Archie Endo, MD.

## 2022-07-10 ENCOUNTER — Encounter (HOSPITAL_COMMUNITY): Payer: Self-pay | Admitting: Obstetrics & Gynecology

## 2022-07-10 MED ORDER — IBUPROFEN 600 MG PO TABS: 600.0000 mg | ORAL_TABLET | Freq: Four times a day (QID) | ORAL | 0 refills | Status: AC

## 2022-07-10 MED ORDER — POLYSACCHARIDE IRON COMPLEX 150 MG PO CAPS: 150.0000 mg | ORAL_CAPSULE | Freq: Every day | ORAL | 0 refills | Status: AC

## 2022-07-10 MED ORDER — OXYCODONE HCL 5 MG PO TABS: 5.0000 mg | ORAL_TABLET | ORAL | 0 refills | Status: AC | PRN

## 2022-07-10 NOTE — Discharge Summary (Signed)
Postpartum Discharge Summary     Patient Name: Rachel Horn DOB: May 06, 1981 MRN: [id number]  Date of admission: 07/07/2022 Delivery date:07/08/2022  Delivering provider: Everett Graff  Date of discharge: 07/10/2022  Admitting diagnosis: Encounter for induction of labor [Z34.90] Intrauterine pregnancy: [redacted]w[redacted]d     Secondary diagnosis:  Principal Problem:   Encounter for induction of labor  Additional problems:  Small for gestational age fetus. Desires permanent sterilization.    Discharge diagnosis: Term Pregnancy Delivered                                              Post partum procedures:postpartum tubal ligation Complications: None  Hospital course: Induction of Labor With Vaginal Delivery   41 y.o. yo [id number] at [redacted]w[redacted]d was admitted to the hospital 07/07/2022 for induction of labor.  Indication for induction:  Small for gestational age .  Patient had an uncomplicated labor course.  Membrane Rupture Time/Date: 12:38 AM ,07/08/2022   Delivery Method:Vaginal, Vacuum (Extractor)  Episiotomy: None  Lacerations:  None  Details of delivery can be found in separate delivery note.  Patient had an uncomplicated postpartum course.  Postpartum tubal lication was done on PPD # 1, please see op note for more details.  Patient is discharged home 07/10/22.  Newborn Data: Birth date:07/08/2022  Birth time:1:17 AM  Gender:Female  Living status:Living  Apgars:9 ,9  Weight:2730 g   Physical exam  Vitals:   07/09/22 1858 07/09/22 2245 07/10/22 0240 07/10/22 0635  BP: 122/80 110/67 136/79 135/86  Pulse: 69 84 65 62  Resp: 18 18 18 18   Temp: 98.5 F (36.9 C) 98.4 F (36.9 C) 98.2 F (36.8 C) 98.5 F (36.9 C)  TempSrc: Oral Oral Oral Oral  SpO2: 100% 96% 100% 100%  Weight:      Height:       General: alert, cooperative, and no distress Lochia: appropriate Uterine Fundus: firm Incision: Dressing is clean, dry, and intact DVT Evaluation: No evidence of DVT seen on physical  exam. No significant calf/ankle edema. Labs: Lab Results  Component Value Date   WBC 10.9 (H) 07/09/2022   HGB 8.9 (L) 07/09/2022   HCT 27.8 (L) 07/09/2022   MCV 83.5 07/09/2022   PLT 157 07/09/2022      Latest Ref Rng & Units 03/18/2020    4:42 AM  CMP  Glucose 70 - 99 mg/dL 100   BUN 6 - 20 mg/dL 5   Creatinine 0.44 - 1.00 mg/dL 0.58   Sodium 135 - 145 mmol/L 141   Potassium 3.5 - 5.1 mmol/L 3.7   Chloride 98 - 111 mmol/L 102   CO2 22 - 32 mmol/L 28   Calcium 8.9 - 10.3 mg/dL 8.6    Edinburgh Score:    07/08/2022    3:20 AM  Edinburgh Postnatal Depression Scale Screening Tool  I have been able to laugh and see the funny side of things. 0  I have looked forward with enjoyment to things. 0  I have blamed myself unnecessarily when things went wrong. 1  I have been anxious or worried for no good reason. 1  I have felt scared or panicky for no good reason. 0  Things have been getting on top of me. 0  I have been so unhappy that I have had difficulty sleeping. 0  I have felt sad or miserable. 0  I have been so unhappy that I have been crying. 1  The thought of harming myself has occurred to me. 0  Edinburgh Postnatal Depression Scale Total 3     After visit meds:  Allergies as of 07/10/2022       Reactions   Latex Swelling, Rash   Penicillins Swelling, Rash   Did it involve swelling of the face/tongue/throat, SOB, or low BP? Unknown Did it involve sudden or severe rash/hives, skin peeling, or any reaction on the inside of your mouth or nose? Yes Did you need to seek medical attention at a hospital or doctor's office? Unknown When did it last happen?   unk    If all above answers are "NO", may proceed with cephalosporin use.        Medication List     TAKE these medications    cholecalciferol 25 MCG (1000 UNIT) tablet Commonly known as: VITAMIN D3 Take 1,000 Units by mouth daily.   ibuprofen 600 MG tablet Commonly known as: ADVIL Take 1 tablet (600 mg  total) by mouth every 6 (six) hours.   iron polysaccharides 150 MG capsule Commonly known as: NIFEREX Take 1 capsule (150 mg total) by mouth daily.   multivitamin-prenatal 27-0.8 MG Tabs tablet Take 1 tablet by mouth daily at 12 noon.   Proctofoam HC rectal foam Generic drug: hydrocortisone-pramoxine Place 1 applicator rectally 2 (two) times daily.   witch hazel-glycerin pad Commonly known as: TUCKS Apply 1 Application topically as needed for itching.       Discharge home in stable condition Infant Feeding: Breast Infant Disposition:home with mother Discharge instruction: per After Visit Summary and Postpartum booklet. Activity: Advance as tolerated. Pelvic rest for 6 weeks.  Diet: routine diet Future Appointments: Future Appointments  Date Time Provider Department Center  07/29/2022 10:45 AM Arnaldo Natal, NP [facility]   Follow up Visit:  Follow-up Information     Ob/Gyn, [place]. Go on 08/11/2022.   Specialty: Obstetrics and Gynecology Why: Postpartum check. Contact information: [address] [place] [address] [phone]                07/10/2022 Prescilla Sours, MD

## 2022-07-13 LAB — SURGICAL PATHOLOGY

## 2022-07-21 ENCOUNTER — Telehealth (HOSPITAL_COMMUNITY): Payer: Self-pay

## 2022-07-21 NOTE — Telephone Encounter (Signed)
Patient did not answer phone call. Voicemail left for patient.   Sharyn Lull [facility], [place] 07/21/2022,1416

## 2022-07-28 NOTE — Progress Notes (Unsigned)
07/28/2022 Rachel Horn [id number] 04-10-1981   CHIEF COMPLAINT: Hemorrhoid  HISTORY OF PRESENT ILLNESS: Rachel Horn is a 41 year old female with a past medical history of asthma, vitamin D deficiency. S/p tubal ligation 07/09/2022.     Latest Ref Rng & Units 07/09/2022    5:53 AM 07/07/2022    7:18 AM 03/18/2020    4:42 AM  CBC  WBC 4.0 - 10.5 K/uL 10.9  11.9  10.7   Hemoglobin 12.0 - 15.0 g/dL 8.9  10.3  11.8   Hematocrit 36.0 - 46.0 % 27.8  31.5  37.1   Platelets 150 - 400 K/uL 157  198  249        Latest Ref Rng & Units 03/18/2020    4:42 AM 03/17/2020    4:18 PM 03/16/2020    3:26 PM  CMP  Glucose 70 - 99 mg/dL 100  146  111   BUN 6 - 20 mg/dL 5  7  5    Creatinine 0.44 - 1.00 mg/dL 0.58  0.71  0.79   Sodium 135 - 145 mmol/L 141  141  136   Potassium 3.5 - 5.1 mmol/L 3.7  3.3  3.2   Chloride 98 - 111 mmol/L 102  103  101   CO2 22 - 32 mmol/L 28  24  25    Calcium 8.9 - 10.3 mg/dL 8.6  8.7  8.9   Total Protein 6.5 - 8.1 g/dL  7.1  7.1   Total Bilirubin 0.3 - 1.2 mg/dL  0.7  0.7   Alkaline Phos 38 - 126 U/L  97  80   AST 15 - 41 U/L  43  23   ALT 0 - 44 U/L  34  20        Past Medical History:  Diagnosis Date   Asthma    Headache    History of pre-eclampsia in prior pregnancy, currently pregnant    sepsis    pyleonephritis   Trichimoniasis    Vitamin D deficiency    Past Surgical History:  Procedure Laterality Date   NO PAST SURGERIES     TUBAL LIGATION Bilateral 07/09/2022   Procedure: POST PARTUM TUBAL LIGATION;  Surgeon: Waymon Amato, MD;  Location: [facility] LD ORS;  Service: Gynecology;  Laterality: Bilateral;   Social History:  Family History:    reports that she has been smoking cigarettes. She has been smoking an average of .5 packs per day. She has never used smokeless tobacco. She reports that she does not drink alcohol and does not use drugs. family history includes Cancer in her maternal grandfather and paternal aunt; Diabetes in her maternal  aunt and maternal grandmother; Hypertension in her maternal aunt and maternal grandmother. Allergies  Allergen Reactions   Latex Swelling and Rash   Penicillins Swelling and Rash    Did it involve swelling of the face/tongue/throat, SOB, or low BP? Unknown Did it involve sudden or severe rash/hives, skin peeling, or any reaction on the inside of your mouth or nose? Yes Did you need to seek medical attention at a hospital or doctor's office? Unknown When did it last happen?   unk    If all above answers are "NO", may proceed with cephalosporin use.       Outpatient Encounter Medications as of 07/29/2022  Medication Sig   cholecalciferol (VITAMIN D3) 25 MCG (1000 UNIT) tablet Take 1,000 Units by mouth daily.   hydrocortisone-pramoxine (PROCTOFOAM HC) rectal foam Place 1  applicator rectally 2 (two) times daily.   ibuprofen (ADVIL) 600 MG tablet Take 1 tablet (600 mg total) by mouth every 6 (six) hours.   iron polysaccharides (NIFEREX) 150 MG capsule Take 1 capsule (150 mg total) by mouth daily.   oxyCODONE (OXY IR/ROXICODONE) 5 MG immediate release tablet Take 1 tablet (5 mg total) by mouth every 4 (four) hours as needed for up to 15 doses (pain scale 4-7).   Prenatal Vit-Fe Fumarate-FA (MULTIVITAMIN-PRENATAL) 27-0.8 MG TABS tablet Take 1 tablet by mouth daily at 12 noon.   witch hazel-glycerin (TUCKS) pad Apply 1 Application topically as needed for itching.   No facility-administered encounter medications on file as of 07/29/2022.     REVIEW OF SYSTEMS:  Gen: Denies fever, sweats or chills. No weight loss.  CV: Denies chest pain, palpitations or edema. Resp: Denies cough, shortness of breath of hemoptysis.  GI: Denies heartburn, dysphagia, stomach or lower abdominal pain. No diarrhea or constipation.  GU : Denies urinary burning, blood in urine, increased urinary frequency or incontinence. MS: Denies joint pain, muscles aches or weakness. Derm: Denies rash, itchiness, skin lesions or  unhealing ulcers. Psych: Denies depression, anxiety, memory loss, suicidal ideation and confusion. Heme: Denies bruising, easy bleeding. Neuro:  Denies headaches, dizziness or paresthesias. Endo:  Denies any problems with DM, thyroid or adrenal function.  PHYSICAL EXAM: There were no vitals taken for this visit. General: Well developed ... in no acute distress. Head: Normocephalic and atraumatic. Eyes:  Sclerae non-icteric, conjunctive pink. Ears: Normal auditory acuity. Mouth: Dentition intact. No ulcers or lesions.  Neck: Supple, no lymphadenopathy or thyromegaly.  Lungs: Clear bilaterally to auscultation without wheezes, crackles or rhonchi. Heart: Regular rate and rhythm. No murmur, rub or gallop appreciated.  Abdomen: Soft, nontender, non distended. No masses. No hepatosplenomegaly. Normoactive bowel sounds x 4 quadrants.  Rectal:  Musculoskeletal: Symmetrical with no gross deformities. Skin: Warm and dry. No rash or lesions on visible extremities. Extremities: No edema. Neurological: Alert oriented x 4, no focal deficits.  Psychological:  Alert and cooperative. Normal mood and affect.  ASSESSMENT AND PLAN:    CC:  Jackie Plum, MD

## 2022-07-29 ENCOUNTER — Ambulatory Visit: Payer: Medicaid Other | Admitting: Nurse Practitioner

## 2022-08-05 DIAGNOSIS — Z419 Encounter for procedure for purposes other than remedying health state, unspecified: Secondary | ICD-10-CM | POA: Diagnosis not present

## 2022-09-04 DIAGNOSIS — Z419 Encounter for procedure for purposes other than remedying health state, unspecified: Secondary | ICD-10-CM | POA: Diagnosis not present

## 2022-10-05 DIAGNOSIS — Z419 Encounter for procedure for purposes other than remedying health state, unspecified: Secondary | ICD-10-CM | POA: Diagnosis not present

## 2022-11-05 DIAGNOSIS — Z419 Encounter for procedure for purposes other than remedying health state, unspecified: Secondary | ICD-10-CM | POA: Diagnosis not present

## 2022-12-04 DIAGNOSIS — Z419 Encounter for procedure for purposes other than remedying health state, unspecified: Secondary | ICD-10-CM | POA: Diagnosis not present

## 2023-01-04 DIAGNOSIS — Z419 Encounter for procedure for purposes other than remedying health state, unspecified: Secondary | ICD-10-CM | POA: Diagnosis not present

## 2023-02-03 DIAGNOSIS — Z419 Encounter for procedure for purposes other than remedying health state, unspecified: Secondary | ICD-10-CM | POA: Diagnosis not present

## 2023-04-12 ENCOUNTER — Encounter (HOSPITAL_COMMUNITY): Payer: Self-pay

## 2023-04-12 ENCOUNTER — Emergency Department (HOSPITAL_COMMUNITY)
Admission: EM | Admit: 2023-04-12 | Discharge: 2023-04-13 | Disposition: A | Discharge status: Home / Self Care | Payer: Medicaid Other | Attending: Emergency Medicine | Admitting: Emergency Medicine

## 2023-04-12 ENCOUNTER — Other Ambulatory Visit: Payer: Self-pay

## 2023-04-12 DIAGNOSIS — T1491XA Suicide attempt, initial encounter: Secondary | ICD-10-CM | POA: Diagnosis present

## 2023-04-12 DIAGNOSIS — F17210 Nicotine dependence, cigarettes, uncomplicated: Secondary | ICD-10-CM | POA: Diagnosis not present

## 2023-04-12 DIAGNOSIS — Z91040 Latex allergy status: Secondary | ICD-10-CM | POA: Insufficient documentation

## 2023-04-12 DIAGNOSIS — J45909 Unspecified asthma, uncomplicated: Secondary | ICD-10-CM | POA: Diagnosis not present

## 2023-04-12 DIAGNOSIS — F32A Depression, unspecified: Secondary | ICD-10-CM | POA: Insufficient documentation

## 2023-04-12 DIAGNOSIS — F411 Generalized anxiety disorder: Secondary | ICD-10-CM | POA: Diagnosis not present

## 2023-04-12 DIAGNOSIS — F331 Major depressive disorder, recurrent, moderate: Secondary | ICD-10-CM | POA: Diagnosis not present

## 2023-04-12 DIAGNOSIS — T402X2A Poisoning by other opioids, intentional self-harm, initial encounter: Secondary | ICD-10-CM | POA: Insufficient documentation

## 2023-04-12 LAB — RAPID URINE DRUG SCREEN, HOSP PERFORMED
Amphetamines: NOT DETECTED
Barbiturates: NOT DETECTED
Benzodiazepines: NOT DETECTED
Cocaine: NOT DETECTED
Opiates: NOT DETECTED
Tetrahydrocannabinol: POSITIVE — AB

## 2023-04-12 LAB — CBC
HCT: 44.4 % (ref 36.0–46.0)
Hemoglobin: 14.2 g/dL (ref 12.0–15.0)
MCH: 28.2 pg (ref 26.0–34.0)
MCHC: 32 g/dL (ref 30.0–36.0)
MCV: 88.1 fL (ref 80.0–100.0)
Platelets: 340 10*3/uL (ref 150–400)
RBC: 5.04 MIL/uL (ref 3.87–5.11)
RDW: 15.4 % (ref 11.5–15.5)
WBC: 6.9 10*3/uL (ref 4.0–10.5)
nRBC: 0 % (ref 0.0–0.2)

## 2023-04-12 LAB — COMPREHENSIVE METABOLIC PANEL
ALT: 10 U/L (ref 0–44)
AST: 15 U/L (ref 15–41)
Albumin: 4.1 g/dL (ref 3.5–5.0)
Alkaline Phosphatase: 65 U/L (ref 38–126)
Anion gap: 9 (ref 5–15)
BUN: 8 mg/dL (ref 6–20)
CO2: 25 mmol/L (ref 22–32)
Calcium: 8.9 mg/dL (ref 8.9–10.3)
Chloride: 103 mmol/L (ref 98–111)
Creatinine, Ser: 0.9 mg/dL (ref 0.44–1.00)
GFR, Estimated: >60 mL/min (ref >60)
Glucose, Bld: 115 mg/dL — ABNORMAL HIGH (ref 70–99)
Potassium: 3.4 mmol/L — ABNORMAL LOW (ref 3.5–5.1)
Sodium: 137 mmol/L (ref 135–145)
Total Bilirubin: 0.3 mg/dL (ref 0.3–1.2)
Total Protein: 7.7 g/dL (ref 6.5–8.1)

## 2023-04-12 LAB — SALICYLATE LEVEL: Salicylate Lvl: <7 mg/dL — ABNORMAL LOW (ref 7.0–30.0)

## 2023-04-12 LAB — ACETAMINOPHEN LEVEL: Acetaminophen (Tylenol), Serum: <10 ug/mL — ABNORMAL LOW (ref 10–30)

## 2023-04-12 LAB — ETHANOL: Alcohol, Ethyl (B): <10 mg/dL (ref <10)

## 2023-04-12 LAB — PREGNANCY, URINE: Preg Test, Ur: NEGATIVE

## 2023-04-12 MED ORDER — NICOTINE 14 MG/24HR TD PT24
14.0000 mg | MEDICATED_PATCH | Freq: Once | TRANSDERMAL | Status: DC
  Administered 2023-04-12: 14 mg via TRANSDERMAL
  Filled 2023-04-12: qty 1

## 2023-04-12 NOTE — ED Notes (Signed)
One pt belongings bag placed in locker # [phone]

## 2023-04-12 NOTE — ED Notes (Signed)
Pt anxious and agitated, stating they want to leave. Pt got up and proceeded to leave through the ED entrance. Security and Patty, Consulting civil engineer are with pt, provider aware and in process of IVC pt.

## 2023-04-12 NOTE — BH Assessment (Addendum)
Patient has been referred to [facility].  Dr. Maryelizabeth Kaufmann will be seeing patient around 23:3).  Sinclair Ship will be the coordinator.  Paramedic Dylan informed. Via secure messaging.

## 2023-04-12 NOTE — ED Triage Notes (Signed)
BIBA from home for SI. Pt reports she took 6 oxy 5/325 at around 1430. Hx of depression, no hx of previous suicide attempts. 128 HR 112/88 BP 18 rr

## 2023-04-12 NOTE — ED Notes (Signed)
Pt belongings was placed in cabinet 1-5. Pt had a phone, gray tee-shirt and pajama pants with owls on them. Pt changed out into [place] scrubs and wand by security. Pt calm sitting on edge of bed anxious.

## 2023-04-12 NOTE — ED Notes (Signed)
Patient to room 34. Patient oriented to room and unit. Patient  upset and tearful.

## 2023-04-12 NOTE — ED Provider Notes (Signed)
[place] EMERGENCY DEPARTMENT AT [facility] Provider Note   CSN: [id number] Arrival date & time: 04/12/23  1520     History  Chief Complaint  Patient presents with   Drug Overdose    Rachel Horn is a 42 y.o. female.  With a history of asthma, vitamin D deficiency who presents to the ED for evaluation of suicide attempt.  She states she took 6 Percocet 5-325 approximately 1 hour prior to arrival in an attempt to end her own life.  She states she "just does not want to live anymore."  She is unsure why she feels this way.  She denies any history of suicide attempts.  No homicidal ideations or auditory/visual hallucinations.  She denies any other symptoms at this time.  She does not take any medications at home.  She lives with her sister.  She states each of them has a child that lives with them.   Drug Overdose       Home Medications Prior to Admission medications   Medication Sig Start Date End Date Taking? Authorizing Provider  cholecalciferol (VITAMIN D3) 25 MCG (1000 UNIT) tablet Take 1,000 Units by mouth daily.    [provider]  hydrocortisone-pramoxine (PROCTOFOAM HC) rectal foam Place 1 applicator rectally 2 (two) times daily. 06/13/22   Gerrit Heck, CNM  ibuprofen (ADVIL) 600 MG tablet Take 1 tablet (600 mg total) by mouth every 6 (six) hours. 07/10/22   Hoover Browns, MD  iron polysaccharides (NIFEREX) 150 MG capsule Take 1 capsule (150 mg total) by mouth daily. 07/10/22 08/21/22  Hoover Browns, MD  oxyCODONE (OXY IR/ROXICODONE) 5 MG immediate release tablet Take 1 tablet (5 mg total) by mouth every 4 (four) hours as needed for up to 15 doses (pain scale 4-7). 07/10/22   Hoover Browns, MD  Prenatal Vit-Fe Fumarate-FA (MULTIVITAMIN-PRENATAL) 27-0.8 MG TABS tablet Take 1 tablet by mouth daily at 12 noon.    [provider]  witch hazel-glycerin (TUCKS) pad Apply 1 Application topically as needed for itching. 06/13/22   Gerrit Heck, CNM      Allergies     Latex and Penicillins    Review of Systems   Review of Systems  Psychiatric/Behavioral:  Positive for self-injury and suicidal ideas.   All other systems reviewed and are negative.   Physical Exam Updated Vital Signs BP (!) 146/101 (BP Location: Right Arm)   Pulse (!) 113   Temp 98.4 F (36.9 C) (Oral)   Resp 20   SpO2 100%  Physical Exam Vitals and nursing note reviewed.  Constitutional:      General: She is not in acute distress.    Appearance: She is well-developed.  HENT:     Head: Normocephalic and atraumatic.  Eyes:     Conjunctiva/sclera: Conjunctivae normal.  Cardiovascular:     Rate and Rhythm: Normal rate and regular rhythm.     Heart sounds: No murmur heard. Pulmonary:     Effort: Pulmonary effort is normal. No respiratory distress.     Breath sounds: Normal breath sounds. No wheezing, rhonchi or rales.  Abdominal:     Palpations: Abdomen is soft.     Tenderness: There is no abdominal tenderness. There is no guarding.  Musculoskeletal:        General: No swelling.     Cervical back: Neck supple.  Skin:    General: Skin is warm and dry.     Capillary Refill: Capillary refill takes less than 2 seconds.  Neurological:  General: No focal deficit present.     Mental Status: She is alert and oriented to person, place, and time.  Psychiatric:     Comments: Anxious, tearful     ED Results / Procedures / Treatments   Labs (all labs ordered are listed, but only abnormal results are displayed) Labs Reviewed  COMPREHENSIVE METABOLIC PANEL - Abnormal; Notable for the following components:      Result Value   Potassium 3.4 (*)    Glucose, Bld 115 (*)    All other components within normal limits  SALICYLATE LEVEL - Abnormal; Notable for the following components:   Salicylate Lvl <7.0 (*)    All other components within normal limits  ACETAMINOPHEN LEVEL - Abnormal; Notable for the following components:   Acetaminophen (Tylenol), Serum <10 (*)    All  other components within normal limits  RAPID URINE DRUG SCREEN, HOSP PERFORMED - Abnormal; Notable for the following components:   Tetrahydrocannabinol POSITIVE (*)    All other components within normal limits  ETHANOL  CBC  PREGNANCY, URINE    EKG None  Radiology No results found.  Procedures Procedures    Medications Ordered in ED Medications - No data to display  ED Course/ Medical Decision Making/ A&P                             Medical Decision Making Amount and/or Complexity of Data Reviewed Labs: ordered.  This patient presents to the ED for concern of suicide attempt, this involves an extensive number of treatment options, and is a complaint that carries with it a high risk of complications and morbidity.  Differential diagnosis includes suicide attempt, depression, anxiety  My initial workup includes medical clearance, observation  Additional history obtained from: Nursing notes from this visit.  I ordered, reviewed and interpreted labs which include: CBC, CMP, hCG, UDS, salicylate, ethanol, acetaminophen.  UDS positive for THC  Afebrile, hemodynamically stable.  42 year old female presents the ED for evaluation of suicide attempt via overdose.  She states she took 6 Percocet pills prior to arrival.  Interestingly, her UDS is negative for opioids.  Patient does have a young child at home that she takes care of.  She was prescribing significant agitation in the emergency department.  Overall do believe she is a danger to herself and others.  She denies any physical complaints.  Her workup was otherwise reassuring.  At this time she is medically cleared and stable for TTS consult.  She will be placed under IVC as that she was attempting to leave the department.  Note: Portions of this report may have been transcribed using voice recognition software. Every effort was made to ensure accuracy; however, inadvertent computerized transcription errors may still be  present.        Final Clinical Impression(s) / ED Diagnoses Final diagnoses:  Suicide attempt [facility])    Rx / DC Orders ED Discharge Orders     None         Mora Bellman 04/12/23 1914    Arby Barrette, MD 04/15/23 1354

## 2023-04-13 ENCOUNTER — Emergency Department (HOSPITAL_COMMUNITY)
Admission: EM | Admit: 2023-04-13 | Discharge: 2023-04-13 | Discharge status: Absent without leave | Payer: Medicaid Other | Source: Home / Self Care

## 2023-04-13 ENCOUNTER — Encounter (HOSPITAL_COMMUNITY): Payer: Self-pay | Admitting: Psychiatry

## 2023-04-13 DIAGNOSIS — F17210 Nicotine dependence, cigarettes, uncomplicated: Secondary | ICD-10-CM

## 2023-04-13 DIAGNOSIS — F411 Generalized anxiety disorder: Secondary | ICD-10-CM

## 2023-04-13 DIAGNOSIS — F331 Major depressive disorder, recurrent, moderate: Secondary | ICD-10-CM

## 2023-04-13 NOTE — Consult Note (Signed)
[facility] Consult Note  Patient Name: Rachel Horn MRN: [id number] DOB: June 27, 1981 DATE OF Consult: 04/13/2023  PRIMARY PSYCHIATRIC DIAGNOSES  1.  Major depressive disorder moderate 2.  Generalized anxiety disorder 3.  Nicotine addiction  RECOMMENDATIONS  Recommendations: Medication recommendations: Wellbutrin SR 100 mg po daily for depression, anxiety and nicotine Non-Medication/therapeutic recommendations: Resources for therapy Is inpatient psychiatric hospitalization recommended for this patient? No (Explain why): Patient is future oriented, she is starting school in the fall, she has 5 children and one is only 9 months, she does not want to die, she was hurt and angry after a fight with her brother and impulsively took the pills, she knows she needs help and is willing to accept it. From a psychiatric perspective, is this patient appropriate for discharge to an outpatient setting/resource or other less restrictive environment for continued care?  Yes (Explain why): Patient is future oriented, she is starting school in the fall, she has 5 children and one is only 9 months, she does not want to die, she was hurt and angry after a fight with her brother and impulsively took the pills, she knows she needs help and is willing to accept it. Communication: Treatment team members (and family members if applicable) who were involved in treatment/care discussions and planning, and with whom we spoke or engaged with via secure text/chat, include the following: Gretta Arab, RN   Thank you for involving [place] in the care of this patient. If you have any additional questions or concerns, please call [phone] and ask for me or the provider on-call.  TELEPSYCHIATRY ATTESTATION & CONSENT  As the provider for this telehealth consult, I attest that I verified the patient's identity using two separate identifiers, introduced myself to the patient, provided my credentials, disclosed my location, and  performed this encounter via a HIPAA-compliant, real-time, face-to-face, two-way, interactive audio and video platform and with the full consent and agreement of the patient (or guardian as applicable.)  Patient physical location: [facility] ED. Telehealth provider physical location: home office in state of [place].  Video start time: 2300 (Central Time) Video end time: 2330 (Central Time)  IDENTIFYING DATA  Rachel Horn is a 42 y.o. year-old female for whom a psychiatric consultation has been ordered by the primary provider. The patient was identified using two separate identifiers.  CHIEF COMPLAINT/REASON FOR CONSULT  Pt overdosed on 6 oxycontin 5 mg tablets  HISTORY OF PRESENT ILLNESS (HPI)  The patient reports depression since her teen years. Today after a fight with her brother she was hurt and angry and impulsively took the pills. She is denying that she is suicidal right now. She reports that she is living with her mother. Her sister also lives there. The patient has 5 children and her sister has 4. There are 12 of them living together. She was the oldest growing up and her "role" in the family is to take care of everyone else. She feels like she has too much on her plate and no one is stepping up to help her. She's tried asking for help but no one helps. She denies that she has ever had any psychiatric care. She states that she has always felt that she could just handle things herself. She states that she is rethinking that today and is willing to accept help. She was educated on Wellbutrin as a suitable antidepressant for people who smoke and are impulsive. She was was educated on the potential side effects and on how  to take the medication. She is also willing to try therapy.  She denies SI, HI, AVH.  PAST PSYCHIATRIC HISTORY   Otherwise as per HPI above.  PAST MEDICAL HISTORY  Past Medical History:  Diagnosis Date   Asthma    Headache    History of pre-eclampsia in prior  pregnancy, currently pregnant    sepsis    pyleonephritis   Trichimoniasis    Vitamin D deficiency      HOME MEDICATIONS  Facility Ordered Medications  Medication   nicotine (NICODERM CQ - dosed in mg/24 hours) patch 14 mg   PTA Medications  Medication Sig   Prenatal Vit-Fe Fumarate-FA (MULTIVITAMIN-PRENATAL) 27-0.8 MG TABS tablet Take 1 tablet by mouth daily at 12 noon.   cholecalciferol (VITAMIN D3) 25 MCG (1000 UNIT) tablet Take 1,000 Units by mouth daily.   hydrocortisone-pramoxine (PROCTOFOAM HC) rectal foam Place 1 applicator rectally 2 (two) times daily.   witch hazel-glycerin (TUCKS) pad Apply 1 Application topically as needed for itching.   ibuprofen (ADVIL) 600 MG tablet Take 1 tablet (600 mg total) by mouth every 6 (six) hours.   oxyCODONE (OXY IR/ROXICODONE) 5 MG immediate release tablet Take 1 tablet (5 mg total) by mouth every 4 (four) hours as needed for up to 15 doses (pain scale 4-7).     ALLERGIES  Allergies  Allergen Reactions   Latex Swelling and Rash   Penicillins Swelling and Rash    Did it involve swelling of the face/tongue/throat, SOB, or low BP? Unknown Did it involve sudden or severe rash/hives, skin peeling, or any reaction on the inside of your mouth or nose? Yes Did you need to seek medical attention at a hospital or doctor's office? Unknown When did it last happen?   unk    If all above answers are "NO", may proceed with cephalosporin use.     SOCIAL & SUBSTANCE USE HISTORY  Social History   Socioeconomic History   Marital status: Single    Spouse name: Not on file   Number of children: Not on file   Years of education: Not on file   Highest education level: Not on file  Occupational History   Not on file  Tobacco Use   Smoking status: Every Day    Packs/day: .5    Types: Cigarettes   Smokeless tobacco: Never  Vaping Use   Vaping Use: Never used  Substance and Sexual Activity   Alcohol use: No   Drug use: No   Sexual activity: Yes     Birth control/protection: None  Other Topics Concern   Not on file  Social History Narrative   Not on file   Social Determinants of Health   Financial Resource Strain: Not on file  Food Insecurity: No Food Insecurity (07/07/2022)   Hunger Vital Sign    Worried About Running Out of Food in the Last Year: Never true    Ran Out of Food in the Last Year: Never true  Transportation Needs: No Transportation Needs (07/07/2022)   PRAPARE - Administrator, Civil Service (Medical): No    Lack of Transportation (Non-Medical): No  Physical Activity: Not on file  Stress: Not on file  Social Connections: Not on file   Social History   Tobacco Use  Smoking Status Every Day   Packs/day: .5   Types: Cigarettes  Smokeless Tobacco Never   Social History   Substance and Sexual Activity  Alcohol Use No   Social History  Substance and Sexual Activity  Drug Use No    Additional pertinent information. Patient did experience trauma starting in her teen years.   FAMILY HISTORY  Family History  Problem Relation Age of Onset   Hypertension Maternal Aunt    Diabetes Maternal Aunt    Cancer Paternal Aunt    Hypertension Maternal Grandmother    Diabetes Maternal Grandmother    Cancer Maternal Grandfather    Asthma Neg Hx    Heart disease Neg Hx    Stroke Neg Hx    Birth defects Neg Hx    Family Psychiatric History (if known):  she states none but also that no one would ever talk about it.   MENTAL STATUS EXAM (MSE)  Presentation  General Appearance:  Appropriate for Environment; Well Groomed  Eye Contact: Good  Speech: Normal Rate  Speech Volume: Normal  Handedness:No data recorded  Mood and Affect  Mood: Depressed; Anxious  Affect: Appropriate   Thought Process  Thought Processes: Coherent; Goal Directed; Linear  Descriptions of Associations: Intact  Orientation: Full (Time, Place and Person)  Thought Content: Abstract Reasoning;  Logical  History of Schizophrenia/Schizoaffective disorder:No data recorded Duration of Psychotic Symptoms:No data recorded Hallucinations: Hallucinations: None  Ideas of Reference: None  Suicidal Thoughts: Suicidal Thoughts: No  Homicidal Thoughts: Homicidal Thoughts: No   Sensorium  Memory: Immediate Good; Recent Good; Remote Good  Judgment: Fair  Insight: Good   Executive Functions  Concentration: Fair  Attention Span: Good  Recall: Good  Fund of Knowledge: Good  Language: Good   Psychomotor Activity  Psychomotor Activity: Psychomotor Activity: Normal   Assets  Assets: Desire for Improvement; Resilience   Sleep  Sleep: Sleep: Fair   VITALS  Blood pressure (!) 146/101, pulse (!) 113, temperature 98.4 F (36.9 C), temperature source Oral, resp. rate 20, SpO2 100 %, unknown if currently breastfeeding.  LABS  Admission on 04/12/2023  Component Date Value Ref Range Status   Sodium 04/12/2023 137  135 - 145 mmol/L Final   Potassium 04/12/2023 3.4 (L)  3.5 - 5.1 mmol/L Final   Chloride 04/12/2023 103  98 - 111 mmol/L Final   CO2 04/12/2023 25  22 - 32 mmol/L Final   Glucose, Bld 04/12/2023 115 (H)  70 - 99 mg/dL Final   Glucose reference range applies only to samples taken after fasting for at least 8 hours.   BUN 04/12/2023 8  6 - 20 mg/dL Final   Creatinine, Ser 04/12/2023 0.90  0.44 - 1.00 mg/dL Final   Calcium 16/07/9603 8.9  8.9 - 10.3 mg/dL Final   Total Protein 54/06/8118 7.7  6.5 - 8.1 g/dL Final   Albumin 14/78/2956 4.1  3.5 - 5.0 g/dL Final   AST 21/30/8657 15  15 - 41 U/L Final   ALT 04/12/2023 10  0 - 44 U/L Final   Alkaline Phosphatase 04/12/2023 65  38 - 126 U/L Final   Total Bilirubin 04/12/2023 0.3  0.3 - 1.2 mg/dL Final   GFR, Estimated 04/12/2023 >60  >60 mL/min Final   Comment: (NOTE) Calculated using the CKD-EPI Creatinine Equation (2021)    Anion gap 04/12/2023 9  5 - 15 Final   Performed at [facility], [address], [place], [place] [address]   Alcohol, Ethyl (B) 04/12/2023 <10  <10 mg/dL Final   Comment: (NOTE) Lowest detectable limit for serum alcohol is 10 mg/dL.  For medical purposes only. Performed at [facility], [address] [place],  [place] [address]    Salicylate Lvl 04/12/2023 <7.0 (L)  7.0 - 30.0 mg/dL Final   Performed at [facility], [address], [place], [place] [address]   Acetaminophen (Tylenol), Serum 04/12/2023 <10 (L)  10 - 30 ug/mL Final   Comment: (NOTE) Therapeutic concentrations vary significantly. A range of 10-30 ug/mL  may be an effective concentration for many patients. However, some  are best treated at concentrations outside of this range. Acetaminophen concentrations >150 ug/mL at 4 hours after ingestion  and >50 ug/mL at 12 hours after ingestion are often associated with  toxic reactions.  Performed at [facility], [address] [place], [place] [address]    WBC 04/12/2023 6.9  4.0 - 10.5 K/uL Final   RBC 04/12/2023 5.04  3.87 - 5.11 MIL/uL Final   Hemoglobin 04/12/2023 14.2  12.0 - 15.0 g/dL Final   HCT 91/47/8295 44.4  36.0 - 46.0 % Final   MCV 04/12/2023 88.1  80.0 - 100.0 fL Final   MCH 04/12/2023 28.2  26.0 - 34.0 pg Final   MCHC 04/12/2023 32.0  30.0 - 36.0 g/dL Final   RDW 62/13/0865 15.4  11.5 - 15.5 % Final   Platelets 04/12/2023 340  150 - 400 K/uL Final   nRBC 04/12/2023 0.0  0.0 - 0.2 % Final   Performed at [facility], [address] [place], [place] [address]   Opiates 04/12/2023 NONE DETECTED  NONE DETECTED Final   Cocaine 04/12/2023 NONE DETECTED  NONE DETECTED Final   Benzodiazepines 04/12/2023 NONE DETECTED  NONE DETECTED Final   Amphetamines 04/12/2023 NONE DETECTED  NONE DETECTED Final   Tetrahydrocannabinol 04/12/2023 POSITIVE (A)  NONE DETECTED Final   Barbiturates 04/12/2023 NONE DETECTED  NONE DETECTED Final   Comment:  (NOTE) DRUG SCREEN FOR MEDICAL PURPOSES ONLY.  IF CONFIRMATION IS NEEDED FOR ANY PURPOSE, NOTIFY LAB WITHIN 5 DAYS.  LOWEST DETECTABLE LIMITS FOR URINE DRUG SCREEN Drug Class                     Cutoff (ng/mL) Amphetamine and metabolites    1000 Barbiturate and metabolites    200 Benzodiazepine                 200 Opiates and metabolites        300 Cocaine and metabolites        300 THC                            50 Performed at [facility], [address] [place], [place] [address]    Preg Test, Ur 04/12/2023 NEGATIVE  NEGATIVE Final   Comment:        THE SENSITIVITY OF THIS METHODOLOGY IS >25 mIU/mL. Performed at [facility], [address] [place], [place] [address]     PSYCHIATRIC REVIEW OF SYSTEMS (ROS)  ROS: Notable for the following relevant positive findings: ROS  Additional findings:      Musculoskeletal: No abnormal movements observed      Gait & Station: Normal      Pain Screening: Denies      Nutrition & Dental Concerns: none noted  RISK FORMULATION/ASSESSMENT  Is the patient experiencing any suicidal or homicidal ideations: No       Explain if yes:  Protective factors considered for safety management: future oriented, Her children, wants help does not want to die.  Risk factors/concerns considered for safety management:  Depression Impulsivity Unmarried  Is there a Astronomer plan with the patient and treatment team to minimize risk factors and promote protective factors: Yes           Explain: monitor in the ED Is crisis care placement or psychiatric hospitalization recommended: No     Based on my current evaluation and risk assessment, patient is determined at this time to be at:  Low risk  *RISK ASSESSMENT Risk assessment is a dynamic process; it is possible that this patient's condition, and risk level, may change. This should be re-evaluated and managed over time as appropriate. Please re-consult  psychiatric consult services if additional assistance is needed in terms of risk assessment and management. If your team decides to discharge this patient, please advise the patient how to best access emergency psychiatric services, or to call 911, if their condition worsens or they feel unsafe in any way.   Koren Shiver, NP Telepsychiatry Consult Services

## 2023-04-13 NOTE — ED Notes (Signed)
TTS in progress
